# Patient Record
Sex: Male | Born: 1980 | Race: White | Hispanic: No | Marital: Married | Smoking: Never smoker
Health system: Southern US, Community
[De-identification: ages and names within clinical notes are randomized; demographics above are authoritative.]

## PROBLEM LIST (undated history)

## (undated) DIAGNOSIS — T7840XA Allergy, unspecified, initial encounter: Secondary | ICD-10-CM

## (undated) DIAGNOSIS — G8929 Other chronic pain: Secondary | ICD-10-CM

## (undated) DIAGNOSIS — J45909 Unspecified asthma, uncomplicated: Secondary | ICD-10-CM

## (undated) HISTORY — DX: Other chronic pain: G89.29

## (undated) HISTORY — DX: Unspecified asthma, uncomplicated: J45.909

## (undated) HISTORY — DX: Allergy, unspecified, initial encounter: T78.40XA

---

## 2003-11-19 HISTORY — PX: MANDIBLE SURGERY: SHX707

## 2004-02-12 ENCOUNTER — Other Ambulatory Visit: Payer: Self-pay

## 2005-01-20 ENCOUNTER — Emergency Department: Payer: Self-pay | Admitting: Emergency Medicine

## 2005-01-25 ENCOUNTER — Emergency Department: Payer: Self-pay | Admitting: Emergency Medicine

## 2005-03-02 ENCOUNTER — Emergency Department (HOSPITAL_COMMUNITY): Admission: EM | Admit: 2005-03-02 | Discharge: 2005-03-02 | Payer: Self-pay | Admitting: Emergency Medicine

## 2005-03-03 ENCOUNTER — Observation Stay (HOSPITAL_COMMUNITY): Admission: EM | Admit: 2005-03-03 | Discharge: 2005-03-04 | Payer: Self-pay | Admitting: Otolaryngology

## 2005-04-02 ENCOUNTER — Ambulatory Visit (HOSPITAL_COMMUNITY): Admission: RE | Admit: 2005-04-02 | Discharge: 2005-04-02 | Payer: Self-pay | Admitting: Otolaryngology

## 2005-04-11 ENCOUNTER — Ambulatory Visit (HOSPITAL_BASED_OUTPATIENT_CLINIC_OR_DEPARTMENT_OTHER): Admission: RE | Admit: 2005-04-11 | Discharge: 2005-04-11 | Payer: Self-pay | Admitting: Otolaryngology

## 2005-04-11 ENCOUNTER — Ambulatory Visit (HOSPITAL_COMMUNITY): Admission: RE | Admit: 2005-04-11 | Discharge: 2005-04-11 | Payer: Self-pay | Admitting: Otolaryngology

## 2007-01-11 IMAGING — DX DG ORTHOPANTOGRAM
1 series · 2 of 2 positions shown · non-contrast
Comparison: 03/03/05.

CLINICAL DATA: 23-year-old with fracture of the mandible. 
 ORTHOPANTOGRAM:

[view not recorded · B · 2 of 2 slices shown]
[im 1/2]
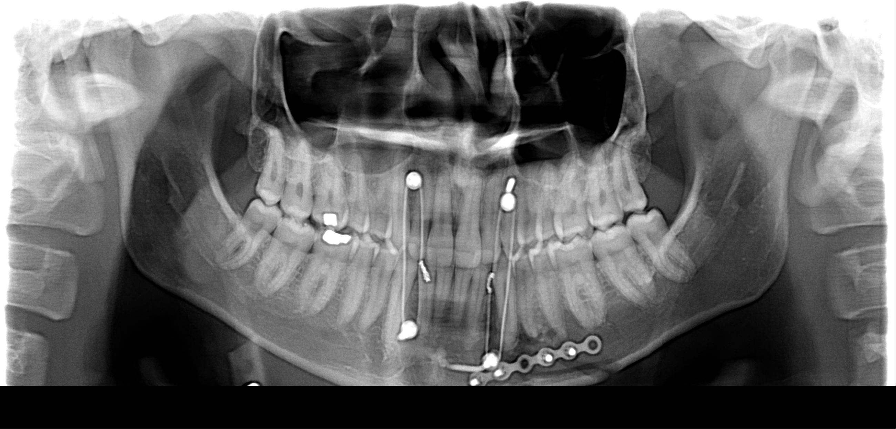
[im 2/2]
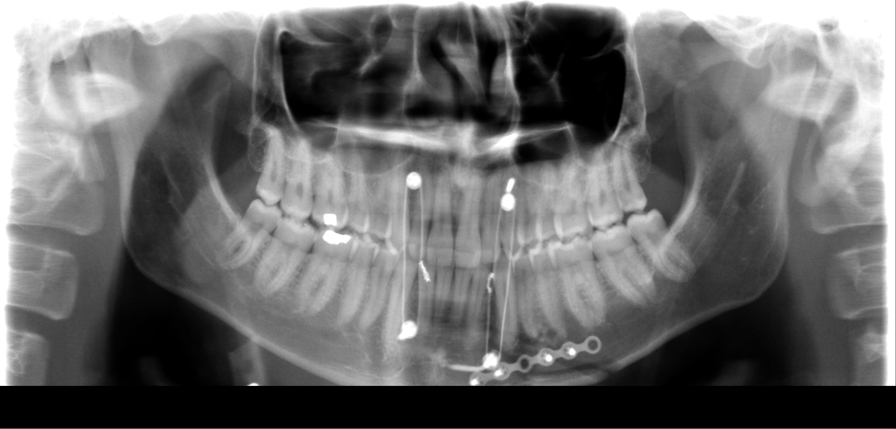

[2 of 2 positions shown; findings below may reference images not displayed]

The patient has undergone open reduction and fixation of left parasymphyseal mandibular fracture.  No new fractures are identified.
IMPRESSION: Status post open reduction and fixation of mandibular fracture.

## 2008-01-04 ENCOUNTER — Emergency Department: Payer: Self-pay | Admitting: Emergency Medicine

## 2008-01-12 ENCOUNTER — Ambulatory Visit: Payer: Self-pay | Admitting: Gastroenterology

## 2008-02-01 ENCOUNTER — Ambulatory Visit: Payer: Self-pay | Admitting: Unknown Physician Specialty

## 2008-04-04 ENCOUNTER — Ambulatory Visit: Payer: Self-pay | Admitting: Unknown Physician Specialty

## 2008-09-27 ENCOUNTER — Ambulatory Visit: Payer: Self-pay | Admitting: Specialist

## 2008-10-31 ENCOUNTER — Inpatient Hospital Stay: Payer: Self-pay | Admitting: Unknown Physician Specialty

## 2009-01-12 ENCOUNTER — Ambulatory Visit: Payer: Self-pay | Admitting: Pain Medicine

## 2010-09-05 ENCOUNTER — Ambulatory Visit: Payer: Self-pay

## 2011-03-29 ENCOUNTER — Emergency Department: Payer: Self-pay | Admitting: Emergency Medicine

## 2012-08-04 DIAGNOSIS — N200 Calculus of kidney: Secondary | ICD-10-CM | POA: Insufficient documentation

## 2013-01-04 ENCOUNTER — Ambulatory Visit: Payer: Self-pay

## 2013-02-01 ENCOUNTER — Ambulatory Visit: Payer: Self-pay

## 2014-01-17 ENCOUNTER — Emergency Department: Payer: Self-pay | Admitting: Emergency Medicine

## 2014-02-16 ENCOUNTER — Emergency Department: Payer: Self-pay | Admitting: Internal Medicine

## 2016-01-09 ENCOUNTER — Emergency Department
Admission: EM | Admit: 2016-01-09 | Discharge: 2016-01-09 | Disposition: A | Discharge status: Home / Self Care | Payer: BLUE CROSS/BLUE SHIELD | Attending: Student | Admitting: Student

## 2016-01-09 ENCOUNTER — Encounter: Payer: Self-pay | Admitting: Emergency Medicine

## 2016-01-09 DIAGNOSIS — Z79899 Other long term (current) drug therapy: Secondary | ICD-10-CM | POA: Diagnosis not present

## 2016-01-09 DIAGNOSIS — G8929 Other chronic pain: Secondary | ICD-10-CM | POA: Diagnosis not present

## 2016-01-09 DIAGNOSIS — M545 Low back pain, unspecified: Secondary | ICD-10-CM

## 2016-01-09 DIAGNOSIS — M549 Dorsalgia, unspecified: Secondary | ICD-10-CM | POA: Diagnosis present

## 2016-01-09 MED ORDER — HYDROMORPHONE HCL 2 MG PO TABS
2.0000 mg | ORAL_TABLET | ORAL | Status: AC
  Administered 2016-01-09: 2 mg via ORAL
  Filled 2016-01-09: qty 1

## 2016-01-09 NOTE — Discharge Instructions (Signed)
Chronic Back Pain  When back pain lasts longer than 3 months, it is called chronic back pain.People with chronic back pain often go through certain periods that are more intense (flare-ups).  CAUSES Chronic back pain can be caused by wear and tear (degeneration) on different structures in your back. These structures include:  The bones of your spine (vertebrae) and the joints surrounding your spinal cord and nerve roots (facets).  The strong, fibrous tissues that connect your vertebrae (ligaments). Degeneration of these structures may result in pressure on your nerves. This can lead to constant pain. HOME CARE INSTRUCTIONS  Avoid bending, heavy lifting, prolonged sitting, and activities which make the problem worse.  Take brief periods of rest throughout the day to reduce your pain. Lying down or standing usually is better than sitting while you are resting.  Take over-the-counter or prescription medicines only as directed by your caregiver. SEEK IMMEDIATE MEDICAL CARE IF:   You have weakness or numbness in one of your legs or feet.  You have trouble controlling your bladder or bowels.  You have nausea, vomiting, abdominal pain, shortness of breath, or fainting.   This information is not intended to replace advice given to you by your health care provider. Make sure you discuss any questions you have with your health care provider.   Document Released: 12/12/2004 Document Revised: 01/27/2012 Document Reviewed: 04/24/2015 Elsevier Interactive Patient Education [employer]   Your chronic pain medicines can not be managed from the ED. You must follow-up with your provider for ongoing symptom management. Follow-up with Dr. Gerilyn Pilgrim as soon as possible.   Emergency care providers appreciate that many patients coming to [place] are in severe pain and we wish to address their pain in the safest, most responsible manner.  It is important to recognize, however, that the proper treatment of  chronic pain differs from that of the pain of injuries and acute illnesses.  Our goal is to provider quality, safe, personalized care and we thank you for giving [place] the opportunity to serve you.  The use of narcotics and related agents for chronic pain syndromes may lead to additional physical and psychological problems.  Nearly as many people die from prescription narcotics each year as die from car crashes.  Additionally, this risk is increased if such prescriptions are obtained from a variety of sources.  Therefore, only your primary care physician or a pain management specialist is able to safely treat such syndromes with narcotic medications long-term.  Documentation revealing such prescriptions have been sought from multiple sources may prohibit [place] from providing a refill or different narcotic medication.  Your name may be checked first through the [facility] Controlled Substances Reporting System.  This database is a record of controlled substance medication prescriptions that the patient has received.  This has been established by [facility] in an effort to eliminate the dangerous, and often life-threatening, practice of obtaining multiple prescriptions from different medical providers.  If you have a chronic pain syndrome (i.e. chronic headaches, recurrent back or neck pain, dental pain, abdominal or pelvic pain without a specific diagnosis, or neuropathic pain such as fibromyalgia) or recurrent visits for the same condition without an acute diagnosis, you may be treated with non-narcotics and other non-addictive medicines.  Allergic reactions or negative side effects that may be reported by a patient to such medications will not typically lead to the use of a narcotic analgesic or other controlled substance as an alternative.  Patients managing chronic pain with  a personal physician should have provisions in place for breakthrough pain.  If you are in crisis, you should call your physician.   If your physician directs you to the emergency department, please have the doctor call and speak to our attending physician concerning your care.  When patients come to the Emergency Department (ED) with acute medical conditions in which the ED physician feels it is appropriate to prescribe narcotic or sedating pain medication, the physician will prescribe these in very limited quantities.  The amount of these medications will last only until you can see your primary care physician in his/her office.  Any patient who returns to the ED seeking refills should expect only non-narcotic pain medications.  In the event an acute medical condition exists and the emergency physician feels it is necessary that the patient be given a narcotic or sedating medication, a responsible adult driver should be present in the room prior to the medication being given by the nurse.  Prescriptions for narcotic or sedating medications that have been lost, stolen, or expired will NOT be refilled in the ED.  Patients who have chronic pain may receive non-narcotic prescriptions until seen by their primary care physician.  It is every patient's personal responsibility to maintain active prescriptions with his or her primary care physician or specialist.

## 2016-01-09 NOTE — ED Notes (Signed)
States he has chronic back pain   Last back injection was 2 weeks ago. States pain has returned  And unable to reach his md  States dilaudid works well but ran out yesterday

## 2016-01-09 NOTE — ED Provider Notes (Signed)
[facility] Emergency Department Provider Note ____________________________________________  Time seen: 2025  I have reviewed the triage vital signs and the nursing notes.  HISTORY  Chief Complaint  Back Pain  HPI Lawrence Pham is a 35 y.o. male due to ED accompanied by his mother for evaluation of chronic back pain that has been persistent since his SI joint injections 2 weeks prior. The patient is currently managed by [facility] neurology for his ongoing pain management. He reports that he was last seen 1 week ago and given a prescription for area since that time he's completed the prescription and when he called the office for follow-up and refill he found the office was closed. He presents to to the ED today requesting pain management for his ongoing symptoms. He denies any worsening of his baseline pain. He denies any distal paresthesias, foot drop, or incontinence of bladder or bowel. He reports an appointment is scheduled or March 8, and is hoping for medication to keep him until that time. He reports his pain at 10/10 in triage.   History reviewed. No pertinent past medical history.  There are no active problems to display for this patient.  History reviewed. No pertinent past surgical history.  Current Outpatient Rx  Name  Route  Sig  Dispense  Refill  . amitriptyline (ELAVIL) 25 MG tablet   Oral   Take 25 mg by mouth at bedtime.         . diazepam (VALIUM) 5 MG tablet   Oral   Take 5 mg by mouth every 12 (twelve) hours as needed for anxiety.         . hydrOXYzine (ATARAX/VISTARIL) 25 MG tablet   Oral   Take 25 mg by mouth 3 (three) times daily as needed.         Marland Kitchen omeprazole (PRILOSEC) 40 MG capsule   Oral   Take 40 mg by mouth daily.         . pregabalin (LYRICA) 150 MG capsule   Oral   Take 150 mg by mouth 2 (two) times daily.          Allergies Nucynta and Tramadol  No family history on file.  Social History Social  History  Substance Use Topics  . Smoking status: Never Smoker   . Smokeless tobacco: None  . Alcohol Use: No   Review of Systems  Constitutional: Negative for fever. Cardiovascular: Negative for chest pain. Respiratory: Negative for shortness of breath. Gastrointestinal: Negative for abdominal pain, vomiting and diarrhea. Genitourinary: Negative for dysuria. Musculoskeletal: Positive for back pain. Skin: Negative for rash. Neurological: Negative for headaches, focal weakness or numbness. ____________________________________________  PHYSICAL EXAM:  VITAL SIGNS: ED Triage Vitals  Enc Vitals Group     BP 01/09/16 1831 131/87 mmHg     Pulse Rate 01/09/16 1831 103     Resp 01/09/16 1831 20     Temp 01/09/16 1831 97.2 F (36.2 C)     Temp Source 01/09/16 1831 Oral     SpO2 01/09/16 1831 96 %     Weight 01/09/16 1831 145 lb (65.772 kg)     Height 01/09/16 1831 6' (1.829 m)     Head Cir --      Peak Flow --      Pain Score 01/09/16 1831 10     Pain Loc --      Pain Edu? --      Excl. in GC? --    Constitutional: Alert and  oriented. Well appearing and in no distress. Head: Normocephalic and atraumatic. Neck: Supple. No thyromegaly. Hematological/Lymphatic/Immunological: No cervical lymphadenopathy. Cardiovascular: Normal rate, regular rhythm.  Respiratory: Normal respiratory effort. No wheezes/rales/rhonchi. Gastrointestinal: Soft and nontender. No distention, rebound, guarding, or organomegaly. Musculoskeletal: Normal spinal alignment without midline tenderness, spasm, deformity, or step-off. Nontender with normal range of motion in all extremities.  Neurologic:  CN II-XII grossly intact. Normal LE DTRs bilaterally. Normal toe dorsiflexion. Normal gait without ataxia. Normal speech and language. No gross focal neurologic deficits are appreciated. Skin:  Skin is warm, dry and intact. No rash noted. Psychiatric: Mood and affect are normal. Patient exhibits appropriate insight  and judgment. ____________________________________________  PROCEDURES  Dilaudid 2 mg PO ____________________________________________  INITIAL IMPRESSION / ASSESSMENT AND PLAN / ED COURSE  He was discharged without a current prescription for refill on his medication. He is given instructions that he must follow with his ongoing and current chronic pain management provider. He is given information regarding the ED's stance on chronic pain management. He verbalizes understanding and is discharged to the care of his mother to follow-up with his primary care provider or pain management specialist as soon as possible. ____________________________________________  FINAL CLINICAL IMPRESSION(S) / ED DIAGNOSES  Final diagnoses:  Chronic low back pain      Lissa Hoard, PA-C 01/10/16 0020  Gayla Doss, MD 01/10/16 [phone]

## 2016-01-09 NOTE — ED Notes (Addendum)
Pt to ed with c/o chronic lower back pain.  Pt states he was seen and had injections in his back about 2 weeks ago, but since then has had burning and pain in bilat legs radiating from lower back. States he called his md but was told to come to ED.  Pt reports he has used dilaudid in the past but he is out of those at this time.

## 2016-05-01 ENCOUNTER — Other Ambulatory Visit: Payer: Self-pay | Admitting: Neurology

## 2016-05-01 DIAGNOSIS — M5416 Radiculopathy, lumbar region: Secondary | ICD-10-CM

## 2016-05-22 ENCOUNTER — Ambulatory Visit: Payer: BLUE CROSS/BLUE SHIELD

## 2017-06-23 ENCOUNTER — Encounter: Payer: Self-pay | Admitting: *Deleted

## 2017-06-23 DIAGNOSIS — Y92009 Unspecified place in unspecified non-institutional (private) residence as the place of occurrence of the external cause: Secondary | ICD-10-CM | POA: Insufficient documentation

## 2017-06-23 DIAGNOSIS — S61012A Laceration without foreign body of left thumb without damage to nail, initial encounter: Secondary | ICD-10-CM | POA: Insufficient documentation

## 2017-06-23 DIAGNOSIS — Y999 Unspecified external cause status: Secondary | ICD-10-CM | POA: Insufficient documentation

## 2017-06-23 DIAGNOSIS — Y939 Activity, unspecified: Secondary | ICD-10-CM | POA: Insufficient documentation

## 2017-06-23 DIAGNOSIS — Z79899 Other long term (current) drug therapy: Secondary | ICD-10-CM | POA: Insufficient documentation

## 2017-06-23 DIAGNOSIS — W25XXXA Contact with sharp glass, initial encounter: Secondary | ICD-10-CM | POA: Insufficient documentation

## 2017-06-23 NOTE — ED Triage Notes (Signed)
Pt has 3 small lacerations to left hand.  Pt stuck hand through a glass window.  Bleeding controlled.  Pt alert.

## 2017-06-24 ENCOUNTER — Emergency Department: Payer: Self-pay

## 2017-06-24 ENCOUNTER — Emergency Department
Admission: EM | Admit: 2017-06-24 | Discharge: 2017-06-24 | Disposition: A | Discharge status: Home / Self Care | Payer: Self-pay | Attending: Emergency Medicine | Admitting: Emergency Medicine

## 2017-06-24 DIAGNOSIS — S61412A Laceration without foreign body of left hand, initial encounter: Secondary | ICD-10-CM

## 2017-06-24 MED ORDER — OXYCODONE HCL 5 MG PO TABS
10.0000 mg | ORAL_TABLET | Freq: Once | ORAL | Status: AC
  Administered 2017-06-24: 10 mg via ORAL
  Filled 2017-06-24: qty 2

## 2017-06-24 NOTE — ED Provider Notes (Signed)
[facility] Emergency Department Provider Note  ____________________________________________   First MD Initiated Contact with Patient 06/24/17 0136     (approximate)  I have reviewed the triage vital signs and the nursing notes.   HISTORY  Chief Complaint Laceration    HPI Lawrence Pham is a 36 y.o. male left-hand dominant unemployed who comes to the emergency department after sustaining multiple lacerations to his left hand. He broke through a window in his own house trying to get into a locked door and suffered the wound. Last tetanus shot was 3-4 years ago. He denies numbness or weakness. He has moderate to severe pain in his left thumb and left thenar eminence. Pain is worse with movement and improved with rest.   No past medical history on file.  There are no active problems to display for this patient.   No past surgical history on file.  Prior to Admission medications   Medication Sig Start Date End Date Taking? Authorizing Provider  amitriptyline (ELAVIL) 25 MG tablet Take 25 mg by mouth at bedtime.    [provider]  diazepam (VALIUM) 5 MG tablet Take 5 mg by mouth every 12 (twelve) hours as needed for anxiety.    [provider]  hydrOXYzine (ATARAX/VISTARIL) 25 MG tablet Take 25 mg by mouth 3 (three) times daily as needed.    [provider]  omeprazole (PRILOSEC) 40 MG capsule Take 40 mg by mouth daily.    [provider]  pregabalin (LYRICA) 150 MG capsule Take 150 mg by mouth 2 (two) times daily.    [provider]    Allergies Nucynta [tapentadol] and Tramadol  No family history on file.  Social History Social History  Substance Use Topics  . Smoking status: Never Smoker  . Smokeless tobacco: Never Used  . Alcohol use No    Review of Systems Constitutional: No fever/chills ENT: No sore throat. Cardiovascular: Denies chest pain. Respiratory: Denies shortness of  breath. Gastrointestinal: No abdominal pain.  No nausea, no vomiting.  No diarrhea.  No constipation. Musculoskeletal: Negative for back pain. Neurological: Negative for headaches   ____________________________________________   PHYSICAL EXAM:  VITAL SIGNS: ED Triage Vitals  Enc Vitals Group     BP 06/23/17 2259 (!) 127/96     Pulse Rate 06/23/17 2259 (!) 115     Resp 06/23/17 2259 20     Temp 06/23/17 2259 98.2 F (36.8 C)     Temp Source 06/23/17 2259 Oral     SpO2 06/23/17 2259 96 %     Weight 06/23/17 2257 170 lb (77.1 kg)     Height 06/23/17 2257 6' (1.829 m)     Head Circumference --      Peak Flow --      Pain Score 06/23/17 2257 7     Pain Loc --      Pain Edu? --      Excl. in GC? --     Constitutional: Alert and oriented 4 appears uncomfortable Head: Atraumatic. Nose: No congestion/rhinnorhea. Mouth/Throat: No trismus Neck: No stridor.   Cardiovascular: Tachycardic regular rhythm Respiratory: Normal respiratory effort.  No retractions. Neurologic:  Normal speech and language. No gross focal neurologic deficits are appreciated.  Skin: 2 lacerations noted one over the proximal phalanx of his thumb 3 cm long the other very superficial over the thenar eminence 1 cm No tenderness over distal radius or distal ulna. No tenderness over snuffbox and no axial load discomfort Sensation  intact to light touch over first dorsal webspace, distal index finger, distal small finger Can flex and oppose  thumb, cross 2 on 3, and extend wrist 2+ radial pulse and less than 2 second capillary refill Compartments are soft     ____________________________________________  LABS (all labs ordered are listed, but only abnormal results are displayed)  Labs Reviewed - No data to display   __________________________________________  EKG   ____________________________________________  RADIOLOGY  X-ray with no foreign body no fracture  ____________________________________________   PROCEDURES  Procedure(s) performed: yes  LACERATION REPAIR Performed by: Merrily BrittleNeil Amee Boothe Authorized by: Merrily BrittleNeil Oluwafemi Villella Consent: Verbal consent obtained. Risks and benefits: risks, benefits and alternatives were discussed Consent given by: patient Patient identity confirmed: provided demographic data Prepped and Draped in normal sterile fashion Wound explored  Laceration Location: Left thumb  Laceration Length: 3cm  No Foreign Bodies seen or palpated  Irrigation method: Sink  Amount of cleaning: Copious   Skin closure: Steri-Strips and Dermabond    Patient tolerance: Patient tolerated the procedure well with no immediate complications.   Procedures  Critical Care performed: no  Observation: no ____________________________________________   INITIAL IMPRESSION / ASSESSMENT AND PLAN / ED COURSE  Pertinent labs & imaging results that were available during my care of the patient were reviewed by me and considered in my medical decision making (see chart for details).  I recommended the patient receive a digital block and sutures however he declined stating he did not like needles. He only wanted Steri-Strips and Dermabond in. I gave him a dose of oxycodone for his significant pain which improved it. His wounds were irrigated copiously with tap water in the sink and then I closed it with good approximation. He is discharged home in good condition.      ____________________________________________   FINAL CLINICAL IMPRESSION(S) / ED DIAGNOSES  Final diagnoses:  Laceration of left hand, foreign body presence unspecified, initial encounter      NEW MEDICATIONS STARTED DURING THIS VISIT:  Discharge Medication List as of 06/24/2017  2:43 AM       Note:  This document was prepared using Dragon voice recognition software and may include unintentional dictation errors.      Merrily Brittleifenbark, Montay Vanvoorhis, MD 06/24/17 [phone]

## 2017-06-24 NOTE — Discharge Instructions (Signed)
Please keep your hand clean and dry and follow-up with your primary care physician in 2 days for a wound check. If you are unable to see your primary care physician please return to our emergency department for recheck. Return sooner for any new or worsening symptoms such as worsening pain, numbness, weakness, or for any other concerns.  It was a pleasure to take care of you today, and thank you for coming to our emergency department.  If you have any questions or concerns before leaving please ask the nurse to grab me and I'm more than happy to go through your aftercare instructions again.  If you were prescribed any opioid pain medication today such as Norco, Vicodin, Percocet, morphine, hydrocodone, or oxycodone please make sure you do not drive when you are taking this medication as it can alter your ability to drive safely.  If you have any concerns once you are home that you are not improving or are in fact getting worse before you can make it to your follow-up appointment, please do not hesitate to call 911 and come back for further evaluation.  Merrily BrittleNeil Benno Brensinger, MD  No results found for this or any previous visit. Dg Hand Complete Left  Result Date: 06/24/2017 CLINICAL DATA:  Three small lacerations of the left hand after sticking hand through glass window tonight. EXAM: LEFT HAND - COMPLETE 3+ VIEW COMPARISON:  None. FINDINGS: There is no evidence of fracture or dislocation. No radiopaque foreign body is identified. There is no evidence of arthropathy or other focal bone abnormality. Soft tissues are unremarkable. IMPRESSION: No fracture or radiopaque foreign body. Reported soft tissue lacerations are not radiographically apparent possibly due to size or projection. Electronically Signed   By: Tollie Ethavid  Kwon M.D.   On: 06/24/2017 01:54

## 2018-12-31 ENCOUNTER — Other Ambulatory Visit: Payer: Self-pay | Admitting: Student

## 2018-12-31 DIAGNOSIS — M5412 Radiculopathy, cervical region: Secondary | ICD-10-CM

## 2018-12-31 DIAGNOSIS — G8929 Other chronic pain: Secondary | ICD-10-CM

## 2018-12-31 DIAGNOSIS — M5441 Lumbago with sciatica, right side: Secondary | ICD-10-CM

## 2019-01-11 ENCOUNTER — Ambulatory Visit: Payer: Medicare Other

## 2019-01-11 ENCOUNTER — Ambulatory Visit
Admission: RE | Admit: 2019-01-11 | Discharge: 2019-01-11 | Disposition: A | Discharge status: Home / Self Care | Payer: Medicare Other | Source: Ambulatory Visit | Attending: Student | Admitting: Student

## 2019-01-11 DIAGNOSIS — M5412 Radiculopathy, cervical region: Secondary | ICD-10-CM | POA: Insufficient documentation

## 2019-01-11 DIAGNOSIS — M5441 Lumbago with sciatica, right side: Secondary | ICD-10-CM | POA: Insufficient documentation

## 2019-01-11 DIAGNOSIS — G8929 Other chronic pain: Secondary | ICD-10-CM | POA: Diagnosis present

## 2019-02-16 ENCOUNTER — Ambulatory Visit: Payer: Medicare Other | Admitting: Nurse Practitioner

## 2019-03-04 NOTE — Progress Notes (Signed)
Patient's Name: Lawrence Pham  MRN: [id number]  Referring Provider: Ivar Drape, PA-C  DOB: 1981/07/29  PCP: Patient, No Pcp Per  DOS: 03/08/2019  Note by: Edward Jolly, MD  Service setting: Ambulatory outpatient  Specialty: Interventional Pain Management  Location: [facility] Pain Management Virtual Visit  Visit type: Initial Patient Evaluation  Patient type: New Patient   Pain Management Virtual Encounter Note - Virtual Visit via Telephone Telehealth (real-time audio visits between healthcare provider and patient).  Patient's Phone No.:  [phone] (home); There is no such number on file (mobile).; (Preferred) [phone] No e-mail address on record No Pharmacies Listed  Pre-screening note:  Our staff contacted Lawrence Pham and offered him an "in person", "face-to-face" appointment versus a telephone encounter. He indicated preferring the telephone encounter, at this time.  Primary Reason(s) for Visit: Tele-Encounter for initial evaluation of one or more chronic problems (new to examiner) potentially causing chronic pain, and posing a threat to normal musculoskeletal function. (Level of risk: High)  I attempted to contact Lawrence Pham on 03/08/2019 at 9:41 AM via telephone and clearly identified myself as Edward Jolly, MD.  Patient did not answer.

## 2019-03-08 ENCOUNTER — Other Ambulatory Visit: Payer: Self-pay

## 2019-03-08 ENCOUNTER — Ambulatory Visit
Payer: Medicare Other | Attending: Student in an Organized Health Care Education/Training Program | Admitting: Student in an Organized Health Care Education/Training Program

## 2019-03-11 ENCOUNTER — Encounter: Payer: Self-pay | Admitting: Student in an Organized Health Care Education/Training Program
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
## 2019-03-15 ENCOUNTER — Ambulatory Visit
Payer: Medicare Other | Attending: Student in an Organized Health Care Education/Training Program | Admitting: Student in an Organized Health Care Education/Training Program

## 2019-03-15 ENCOUNTER — Other Ambulatory Visit: Payer: Self-pay

## 2019-03-15 DIAGNOSIS — M170 Bilateral primary osteoarthritis of knee: Secondary | ICD-10-CM | POA: Insufficient documentation

## 2019-03-15 DIAGNOSIS — M5442 Lumbago with sciatica, left side: Secondary | ICD-10-CM | POA: Diagnosis not present

## 2019-03-15 DIAGNOSIS — G894 Chronic pain syndrome: Secondary | ICD-10-CM

## 2019-03-15 DIAGNOSIS — M542 Cervicalgia: Secondary | ICD-10-CM | POA: Diagnosis not present

## 2019-03-15 DIAGNOSIS — F1290 Cannabis use, unspecified, uncomplicated: Secondary | ICD-10-CM | POA: Insufficient documentation

## 2019-03-15 DIAGNOSIS — M25551 Pain in right hip: Secondary | ICD-10-CM

## 2019-03-15 DIAGNOSIS — M25552 Pain in left hip: Secondary | ICD-10-CM

## 2019-03-15 DIAGNOSIS — M5416 Radiculopathy, lumbar region: Secondary | ICD-10-CM

## 2019-03-15 DIAGNOSIS — M533 Sacrococcygeal disorders, not elsewhere classified: Secondary | ICD-10-CM

## 2019-03-15 DIAGNOSIS — M5441 Lumbago with sciatica, right side: Secondary | ICD-10-CM

## 2019-03-15 DIAGNOSIS — G8929 Other chronic pain: Secondary | ICD-10-CM | POA: Insufficient documentation

## 2020-07-13 ENCOUNTER — Other Ambulatory Visit: Payer: Self-pay

## 2020-07-13 ENCOUNTER — Ambulatory Visit
Payer: Medicare Other | Attending: Student in an Organized Health Care Education/Training Program | Admitting: Student in an Organized Health Care Education/Training Program

## 2020-07-13 ENCOUNTER — Encounter: Payer: Self-pay | Admitting: Student in an Organized Health Care Education/Training Program

## 2020-07-13 VITALS — BP 116/65 | HR 93 | Temp 97.3°F | Resp 18 | Ht 72.0 in | Wt 160.0 lb

## 2020-07-13 DIAGNOSIS — M533 Sacrococcygeal disorders, not elsewhere classified: Secondary | ICD-10-CM | POA: Insufficient documentation

## 2020-07-13 DIAGNOSIS — M5416 Radiculopathy, lumbar region: Secondary | ICD-10-CM | POA: Diagnosis present

## 2020-07-13 DIAGNOSIS — G894 Chronic pain syndrome: Secondary | ICD-10-CM | POA: Insufficient documentation

## 2020-07-13 DIAGNOSIS — M25551 Pain in right hip: Secondary | ICD-10-CM | POA: Diagnosis present

## 2020-07-13 DIAGNOSIS — M25552 Pain in left hip: Secondary | ICD-10-CM | POA: Diagnosis present

## 2020-07-13 DIAGNOSIS — G8929 Other chronic pain: Secondary | ICD-10-CM | POA: Diagnosis present

## 2020-07-13 NOTE — Patient Instructions (Signed)

## 2020-07-13 NOTE — Progress Notes (Signed)
PROVIDER NOTE: Information contained herein reflects review and annotations entered in association with encounter. Interpretation of such information and data should be left to medically-trained personnel. Information provided to patient can be located elsewhere in the medical record under "Patient Instructions". Document created using STT-dictation technology, any transcriptional errors that may result from process are unintentional.    Patient: Lawrence Pham  Service Category: E/M  Provider: Gillis Santa, MD  DOB: 03/16/1981  DOS: 07/13/2020  Specialty: Interventional Pain Management  MRN: [id number]  Setting: Ambulatory outpatient  PCP: System, Pcp Not In  Type: Established Patient    Referring Provider: No ref. provider found  Location: Office  Delivery: Face-to-face     HPI  Reason for encounter: Lawrence Pham, a 39 y.o. year old male, is here today for evaluation and management of his Chronic SI joint pain [M53.3, G89.29]. Lawrence Pham primary complain today is Back Pain, Hip Pain, and Knee Pain Last encounter: Practice (Visit date not found). My last encounter with him was on Visit date not found. Pertinent problems: Lawrence Pham has Chronic pain syndrome; Chronic bilateral low back pain with bilateral sciatica; Cervicalgia; Bilateral primary osteoarthritis of knee; Bilateral hip pain; Chronic SI joint pain; and Chronic lumbar radiculopathy on their pertinent problem list. Pain Assessment: Severity of Chronic pain is reported as a 7 /10. Location: Back Mid/radiates from mid back to low back and down both legs in the back to knee on left and to ankle on right.. Onset: More than a month ago. Quality: Sharp, Stabbing, Tingling (hot poker). Timing: Constant. Modifying factor(s): lying on heating pad. Vitals:  height is 6' (1.829 m) and weight is 160 lb (72.6 kg). His temperature is 97.3 F (36.3 C) (abnormal). His blood pressure is 116/65 and his pulse is 93. His respiration is 18 and oxygen  saturation is 98%.   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  Patient is referred by neurosurgery.  Patient was previously at a pain clinic in [place] in the past.  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  He had to wean himself off and he did that by utilizing CBD and THC.  Patient states that his last narcotic intake was in December 2018 and that he is not interested in narcotic medications as a treatment option in managing his pain.  Patient is also had previous epidural steroid injections as well as lumbar facet medial branch nerve blocks as well as lumbar radiofrequency ablation.  He states that the lumbar radiofrequency ablation was not effective for long-term pain management although he did get good short-term relief with the diagnostic lumbar facet medial branch nerve blocks.  He states that these were over 6 years ago.  Patient was honest and states that he does utilize [facility] and CBD in helping to manage his pain."  Patient was recently seen by neurosurgery. He was recommended to follow-up with pain management. 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 He is having pain with weightbearing on the right side. The pain significantly reduces when he sits down. Would like to obtain x-rays of his right hip and right sacroiliac joint and consider diagnostic right hip and right sacroiliac joint injection. Risks and benefits reviewed and patient would like to proceed.   ROS  Constitutional: Denies any fever or chills positive facial pain Gastrointestinal: No reported hemesis, hematochezia, vomiting, or acute GI distress Musculoskeletal: Right hip, right buttock pain Neurological: No reported episodes of acute onset apraxia, aphasia, dysarthria,  agnosia, amnesia, paralysis, loss of coordination, or loss of consciousness  Medication Review  gabapentin and glycopyrrolate  History Review  Allergy: Lawrence Pham is allergic to nucynta [tapentadol] and tramadol. Drug: Lawrence Pham  reports no history of drug use. Alcohol:  reports no history of alcohol use. Tobacco:  reports that he has never smoked. He uses smokeless tobacco. Social: Lawrence Pham  reports that he has never smoked. He uses smokeless tobacco. He reports that he does not drink alcohol and does not use drugs. Medical:  has no past medical history on file. Surgical: Lawrence Pham  has no past surgical history on file. Family: family history is not on file.  Laboratory Chemistry Profile   Renal No results found for: BUN, CREATININE, LABCREA, BCR, GFR, GFRAA, GFRNONAA, LABVMA, EPIRU, EPINEPH24HUR, NOREPRU, NOREPI24HUR, DOPARU, [id number]   Hepatic No results found for: AST, ALT, ALBUMIN, ALKPHOS, HCVAB, AMYLASE, LIPASE, AMMONIA   Electrolytes No results found for: NA, K, CL, CALCIUM, MG, PHOS   Bone No results found for: VD25OH, [id number], [id number], [id number], 25OHVITD1, 25OHVITD2, 25OHVITD3, TESTOFREE, TESTOSTERONE   Inflammation (CRP: Acute Phase) (ESR: Chronic Phase) No results found for: CRP, ESRSEDRATE, LATICACIDVEN     Note: Above Lab results reviewed.  Recent Imaging Review  MR LUMBAR SPINE WO CONTRAST CLINICAL DATA:  Bull rider. Multiple previous injuries. Back pain. Numbness and tingling.  EXAM: MRI LUMBAR SPINE WITHOUT CONTRAST  TECHNIQUE: Multiplanar, multisequence MR imaging of the lumbar spine was performed. No intravenous contrast was administered.  COMPARISON:  Radiography 01/18/2014.  FINDINGS: Segmentation:  5 lumbar type vertebral bodies.  Alignment:  Normal  Vertebrae:  No evidence of fracture or primary bone lesion.  Conus medullaris and cauda equina: Conus extends to the L1 level. Conus and cauda equina appear normal.  Paraspinal  and other soft tissues: Negative  Disc levels:  No abnormality at L4-5 or above. The discs are normal. No facet arthropathy. No canal or foraminal stenosis.  L5-S1: Disc degeneration with annular bulging and annular fissure. This contacts the thecal sac in the S1 root sleeves but does not cause neural compression. This could be associated with back pain or neural irritation.  IMPRESSION: Single level pathology at L5-S1. Disc degeneration with annular fissures and annular bulging. The annulus contacts the thecal sac in the S1 nerves. Neural compression is not seen, but this could be associated with nerve irritation.  Electronically Signed   By: Nelson Chimes M.D.   On: 01/12/2019 09:22 MR CERVICAL SPINE WO CONTRAST CLINICAL DATA:  Bowl rider. Multiple falls. Neck and back pain. Numbness and tingling in the hands.  EXAM: MRI CERVICAL SPINE WITHOUT CONTRAST  TECHNIQUE: Multiplanar, multisequence MR imaging of the cervical spine was performed. No intravenous contrast was administered.  COMPARISON:  CT 03/29/2011  FINDINGS: Alignment: No malalignment.  Vertebrae: No evidence of regional fracture.  Cord: No cord compression or primary cord lesion.  Posterior Fossa, vertebral arteries, paraspinal tissues: Negative  Disc levels:  No abnormality from the foramen magnum through C3-4.  C4-5: Minimal disc bulge.  No stenosis.  C5-6: Mild disc bulge.  No stenosis.  C6-7: Mild disc bulge.  No stenosis.  C7-T1 and T1-2: Normal.  T2-3: Central disc herniation with slight caudal migration effaces the ventral subarachnoid space and may indent the ventral cord slightly.  This level was not studied in the axial plane with this cervical study. If clinically a T2-3 disc herniation could explain the symptoms, consider thoracic evaluation.  IMPRESSION: Mild non-compressive disc bulges from C4-5 through C6-7.  Central disc herniation at T2-3 with slight caudal down  turning. Narrowing of the ventral subarachnoid space and slight indentation of the ventral cord. Consider thoracic exam if this could relate to the clinical presentation.  Electronically Signed   By: Nelson Chimes M.D.   On: 01/12/2019 09:12 Note: Reviewed        Physical Exam  General appearance: Well nourished, well developed, and well hydrated. In no apparent acute distress Mental status: Alert, oriented x 3 (person, place, & time)       Respiratory: No evidence of acute respiratory distress Eyes: PERLA Vitals: BP 116/65   Pulse 93   Temp (!) 97.3 F (36.3 C)   Resp 18   Ht 6' (1.829 m)   Wt 160 lb (72.6 kg)   SpO2 98%   BMI 21.70 kg/m  BMI: Estimated body mass index is 21.7 kg/m as calculated from the following:   Height as of this encounter: 6' (1.829 m).   Weight as of this encounter: 160 lb (72.6 kg). Ideal: Ideal body weight: 77.6 kg (171 lb 1.2 oz)   Lumbar Spine Area Exam  Skin & Axial Inspection: No masses, redness, or swelling Alignment: Symmetrical Functional ROM: Pain restricted ROM       Stability: No instability detected Muscle Tone/Strength: Functionally intact. No obvious neuro-muscular anomalies detected. Sensory (Neurological): Articular pain pattern Right hip arthralgia  Provocative Tests: Hyperextension/rotation test: (+) bilaterally for facet joint pain. Lumbar quadrant test (Kemp's test): deferred today       Lateral bending test: (+) ipsilateral radicular pain, bilaterally. Positive for bilateral foraminal stenosis. Patrick's Maneuver: (+) for right-sided S-I arthralgia and for right hip arthralgia FABER* test: (+) for right-sided S-I arthralgia and for right hip arthralgia S-I anterior distraction/compression test: deferred today         S-I lateral compression test: deferred today         S-I Thigh-thrust test: deferred today         S-I Gaenslen's test: deferred today         *(Flexion, ABduction and External Rotation)   Gait & Posture  Assessment  Ambulation: Patient ambulates using a cane Gait: Limited. Using assistive device to ambulate Posture: Difficulty standing up straight, due to pain  Lower Extremity Exam    Side: Right lower extremity  Side: Left lower extremity  Stability: No instability observed          Stability: No instability observed          Skin & Extremity Inspection: Skin color, temperature, and hair growth are WNL. No peripheral edema or cyanosis. No masses, redness, swelling, asymmetry, or associated skin lesions. No contractures.  Skin & Extremity Inspection: Skin color, temperature, and hair growth are WNL. No peripheral edema or cyanosis. No masses, redness, swelling, asymmetry, or associated skin lesions. No contractures.  Functional ROM: Pain restricted ROM for hip joint          Functional ROM: Unrestricted ROM                  Muscle Tone/Strength: Functionally intact. No obvious neuro-muscular anomalies detected.  Muscle Tone/Strength: Functionally intact. No obvious neuro-muscular anomalies detected.  Sensory (Neurological): Arthropathic arthralgia        Sensory (Neurological): Unimpaired  DTR: Patellar: deferred today Achilles: deferred today Plantar: deferred today  DTR: Patellar: deferred today Achilles: deferred today Plantar: deferred today  Palpation: No palpable anomalies  Palpation: No palpable anomalies    Assessment   Status Diagnosis  Worsening Worsening Worsening 1. Chronic SI joint pain   2. Bilateral hip pain   3. Chronic lumbar radiculopathy   4. Chronic pain syndrome      Updated Problems: Problem  Chronic Lumbar Radiculopathy  Chronic Pain Syndrome  Chronic Bilateral Low Back Pain With Bilateral Sciatica  Cervicalgia  Bilateral Primary Osteoarthritis of Knee  Bilateral Hip Pain  Chronic Si Joint Pain    Plan of Care   Right hip pain: Right hip x-ray, diagnostic right intra-articular hip injection Right SI joint pain: Positive Patrick's and  Faber's test on the right.  Right SI joint x-ray, right diagnostic sacroiliac joint injection Patient not interested in medication management From a lumbar spine standpoint, has failed multiple lumbar epidural steroid injections, lumbar facet medial branch nerve blocks, lumbar RFA.  Can consider Sprint PNS in the future.  Orders:  Orders Placed This Encounter  Procedures  . SACROILIAC JOINT INJECTION    Standing Status:   Future    Standing Expiration Date:   08/13/2020    Scheduling Instructions:     Side: RIGHT     Sedation: without     Timeframe: ASAP    Order Specific Question:   Where will this procedure be performed?    Answer:   [facility]  . HIP INJECTION    Standing Status:   Future    Standing Expiration Date:   10/13/2020    Scheduling Instructions:     Side: RIGHT     Sedation: without     Timeframe: As soon as schedule allows  . DG HIP UNILAT W OR W/O PELVIS 2-3 VIEWS RIGHT    Standing Status:   Future    Standing Expiration Date:   07/13/2021    Scheduling Instructions:     Please describe any evidence of DJD, such as joint narrowing, asymmetry, cysts, or any anomalies in bone density, production, or erosion.    Order Specific Question:   Reason for Exam (SYMPTOM  OR DIAGNOSIS REQUIRED)    Answer:   Right hip pain/arthralgia    Order Specific Question:   Preferred imaging location?    Answer:   [facility]    Order Specific Question:   Call Results- Best Contact Number?    Answer:   [phone] (Pain Clinic facility) (Dr. Dossie Arbour)  . DG Si Joints    Standing Status:   Future    Standing Expiration Date:   10/13/2020    Order Specific Question:   Reason for Exam (SYMPTOM  OR DIAGNOSIS REQUIRED)    Answer:   Right hip pain/arthralgia    Order Specific Question:   Preferred imaging location?    Answer:   [facility]    Order Specific Question:   Call Results- Best Contact Number?    Answer:   [phone] [facility])   Follow-up  plan:   Return in about 2 weeks (around 07/27/2020) for R SI-J + R hip , without sedation.   Recent Visits No visits were found meeting these conditions. Showing recent visits within past 90 days and meeting all other requirements Today's Visits Date Type Provider Dept  07/13/20 Office Visit Gillis Santa, MD [facility]  Showing today's visits and meeting all other requirements Future Appointments  Date Type Provider Dept  08/02/20 Appointment Gillis Santa, MD [facility]  Showing future appointments within next 90 days and meeting all other requirements  I discussed the assessment and treatment plan with the patient. The patient was provided an opportunity to ask questions and all were answered. The patient agreed with the plan and demonstrated an understanding of the instructions.  Patient advised to call back or seek an in-person evaluation if the symptoms or condition worsens.  Duration of encounter: 30 minutes.  Note by: Gillis Santa, MD Date: 07/13/2020; Time: 12:22 PM

## 2020-07-27 ENCOUNTER — Ambulatory Visit
Admission: RE | Admit: 2020-07-27 | Discharge: 2020-07-27 | Disposition: A | Discharge status: Home / Self Care | Payer: Medicare Other | Attending: Student in an Organized Health Care Education/Training Program | Admitting: Student in an Organized Health Care Education/Training Program

## 2020-07-27 ENCOUNTER — Ambulatory Visit
Admission: RE | Admit: 2020-07-27 | Discharge: 2020-07-27 | Disposition: A | Discharge status: Home / Self Care | Payer: Medicare Other | Source: Ambulatory Visit | Attending: Student in an Organized Health Care Education/Training Program | Admitting: Student in an Organized Health Care Education/Training Program

## 2020-07-27 ENCOUNTER — Other Ambulatory Visit: Payer: Self-pay

## 2020-07-27 DIAGNOSIS — M25552 Pain in left hip: Secondary | ICD-10-CM

## 2020-07-27 DIAGNOSIS — M533 Sacrococcygeal disorders, not elsewhere classified: Secondary | ICD-10-CM | POA: Insufficient documentation

## 2020-07-27 DIAGNOSIS — M25551 Pain in right hip: Secondary | ICD-10-CM | POA: Diagnosis present

## 2020-07-27 DIAGNOSIS — G8929 Other chronic pain: Secondary | ICD-10-CM | POA: Diagnosis present

## 2020-07-27 DIAGNOSIS — G894 Chronic pain syndrome: Secondary | ICD-10-CM | POA: Diagnosis present

## 2020-07-31 ENCOUNTER — Telehealth: Payer: Self-pay | Admitting: *Deleted

## 2020-07-31 NOTE — Telephone Encounter (Signed)
X-ray results read to patient. 

## 2020-08-02 ENCOUNTER — Other Ambulatory Visit: Payer: Self-pay

## 2020-08-02 ENCOUNTER — Ambulatory Visit (HOSPITAL_BASED_OUTPATIENT_CLINIC_OR_DEPARTMENT_OTHER): Payer: Medicare Other | Admitting: Student in an Organized Health Care Education/Training Program

## 2020-08-02 ENCOUNTER — Ambulatory Visit
Admission: RE | Admit: 2020-08-02 | Discharge: 2020-08-02 | Disposition: A | Discharge status: Home / Self Care | Payer: Medicare Other | Source: Ambulatory Visit | Attending: Student in an Organized Health Care Education/Training Program | Admitting: Student in an Organized Health Care Education/Training Program

## 2020-08-02 ENCOUNTER — Encounter: Payer: Self-pay | Admitting: Student in an Organized Health Care Education/Training Program

## 2020-08-02 VITALS — BP 124/92 | HR 79 | Temp 97.9°F | Resp 16 | Ht 72.0 in | Wt 160.0 lb

## 2020-08-02 DIAGNOSIS — M791 Myalgia, unspecified site: Secondary | ICD-10-CM | POA: Insufficient documentation

## 2020-08-02 DIAGNOSIS — G894 Chronic pain syndrome: Secondary | ICD-10-CM | POA: Insufficient documentation

## 2020-08-02 DIAGNOSIS — M533 Sacrococcygeal disorders, not elsewhere classified: Secondary | ICD-10-CM

## 2020-08-02 DIAGNOSIS — M7918 Myalgia, other site: Secondary | ICD-10-CM | POA: Diagnosis not present

## 2020-08-02 DIAGNOSIS — G8929 Other chronic pain: Secondary | ICD-10-CM | POA: Diagnosis not present

## 2020-08-02 MED ORDER — METHYLPREDNISOLONE ACETATE 40 MG/ML IJ SUSP
INTRAMUSCULAR | Status: AC
  Filled 2020-08-02: qty 1

## 2020-08-02 MED ORDER — DEXAMETHASONE SODIUM PHOSPHATE 10 MG/ML IJ SOLN
INTRAMUSCULAR | Status: AC
  Filled 2020-08-02: qty 1

## 2020-08-02 MED ORDER — IOHEXOL 180 MG/ML  SOLN
10.0000 mL | Freq: Once | INTRAMUSCULAR | Status: AC
  Administered 2020-08-02: 10 mL via INTRA_ARTICULAR

## 2020-08-02 MED ORDER — LIDOCAINE HCL 4 % EX SOLN
CUTANEOUS | Status: AC
  Filled 2020-08-02: qty 50

## 2020-08-02 MED ORDER — METHYLPREDNISOLONE ACETATE 40 MG/ML IJ SUSP
40.0000 mg | Freq: Once | INTRAMUSCULAR | Status: AC
  Administered 2020-08-02: 40 mg via INTRA_ARTICULAR

## 2020-08-02 MED ORDER — ROPIVACAINE HCL 2 MG/ML IJ SOLN
4.0000 mL | Freq: Once | INTRAMUSCULAR | Status: AC
  Administered 2020-08-02: 4 mL via INTRA_ARTICULAR

## 2020-08-02 MED ORDER — LIDOCAINE HCL 2 % IJ SOLN
20.0000 mL | Freq: Once | INTRAMUSCULAR | Status: AC
  Administered 2020-08-02: 400 mg

## 2020-08-02 MED ORDER — ROPIVACAINE HCL 2 MG/ML IJ SOLN
INTRAMUSCULAR | Status: AC
  Filled 2020-08-02: qty 10

## 2020-08-02 NOTE — Progress Notes (Signed)
Safety precautions to be maintained throughout the outpatient stay will include: orient to surroundings, keep bed in low position, maintain call bell within reach at all times, provide assistance with transfer out of bed and ambulation.  

## 2020-08-02 NOTE — Progress Notes (Signed)
PROVIDER NOTE: Information contained herein reflects review and annotations entered in association with encounter. Interpretation of such information and data should be left to medically-trained personnel. Information provided to patient can be located elsewhere in the medical record under "Patient Instructions". Document created using STT-dictation technology, any transcriptional errors that may result from process are unintentional.    Patient: Lawrence Pham  Service Category: Procedure  Provider: Edward Jolly, MD  DOB: June 18, 1981  DOS: 08/02/2020  Location: [facility]  MRN: [id number]  Setting: Ambulatory - outpatient  Referring Provider: No ref. provider found  Type: Established Patient  Specialty: Interventional Pain Management  PCP: Pcp, No   Primary Reason for Visit: Interventional Pain Management Treatment. CC: Hip Pain (left) and Leg Pain (left)  Procedure:          Anesthesia, Analgesia, Anxiolysis:  Type: Diagnostic Sacroiliac Joint Steroid Injection         And right piriformis trigger point injection Region: Inferior Lumbosacral Region Level: PIIS (Posterior Inferior Iliac Spine) Laterality: Right-Side  Type: Local Anesthesia  Local Anesthetic: Lidocaine 1-2%  Position: Prone           Indications: 1. Chronic SI joint pain   2. Piriformis muscle pain   3. Chronic pain syndrome    Pain Score: Pre-procedure: 8 /10 Post-procedure: 0-No pain (moving around)/10   Pre-op Assessment:  Lawrence Pham is a 39 y.o. (year old), male patient, seen today for interventional treatment. He  has no past surgical history on file. Lawrence Pham has a current medication list which includes the following prescription(s): gabapentin and glycopyrrolate. His primarily concern today is the Hip Pain (left) and Leg Pain (left)  Initial Vital Signs:  Pulse/HCG Rate: 87  Temp: 97.9 F (36.6 C) Resp: 16 BP: 112/82 SpO2: 98 %  BMI: Estimated body mass index is 21.7 kg/m as  calculated from the following:   Height as of this encounter: 6' (1.829 m).   Weight as of this encounter: 160 lb (72.6 kg).  Risk Assessment: Allergies: Reviewed. He is allergic to nucynta [tapentadol] and tramadol.  Allergy Precautions: None required Coagulopathies: Reviewed. None identified.  Blood-thinner therapy: None at this time Active Infection(s): Reviewed. None identified. Lawrence Pham is afebrile  Site Confirmation: Lawrence Pham was asked to confirm the procedure and laterality before marking the site Procedure checklist: Completed Consent: Before the procedure and under the influence of no sedative(s), amnesic(s), or anxiolytics, the patient was informed of the treatment options, risks and possible complications. To fulfill our ethical and legal obligations, as recommended by the American Medical Association's Code of Ethics, I have informed the patient of my clinical impression; the nature and purpose of the treatment or procedure; the risks, benefits, and possible complications of the intervention; the alternatives, including doing nothing; the risk(s) and benefit(s) of the alternative treatment(s) or procedure(s); and the risk(s) and benefit(s) of doing nothing. The patient was provided information about the general risks and possible complications associated with the procedure. These may include, but are not limited to: failure to achieve desired goals, infection, bleeding, organ or nerve damage, allergic reactions, paralysis, and death. In addition, the patient was informed of those risks and complications associated to the procedure, such as failure to decrease pain; infection; bleeding; organ or nerve damage with subsequent damage to sensory, motor, and/or autonomic systems, resulting in permanent pain, numbness, and/or weakness of one or several areas of the body; allergic reactions; (i.e.: anaphylactic reaction); and/or death. Furthermore, the patient was informed of those  risks and  complications associated with the medications. These include, but are not limited to: allergic reactions (i.e.: anaphylactic or anaphylactoid reaction(s)); adrenal axis suppression; blood sugar elevation that in diabetics may result in ketoacidosis or comma; water retention that in patients with history of congestive heart failure may result in shortness of breath, pulmonary edema, and decompensation with resultant heart failure; weight gain; swelling or edema; medication-induced neural toxicity; particulate matter embolism and blood vessel occlusion with resultant organ, and/or nervous system infarction; and/or aseptic necrosis of one or more joints. Finally, the patient was informed that Medicine is not an exact science; therefore, there is also the possibility of unforeseen or unpredictable risks and/or possible complications that may result in a catastrophic outcome. The patient indicated having understood very clearly. We have given the patient no guarantees and we have made no promises. Enough time was given to the patient to ask questions, all of which were answered to the patient's satisfaction. Lawrence Pham has indicated that he wanted to continue with the procedure. Attestation: I, the ordering provider, attest that I have discussed with the patient the benefits, risks, side-effects, alternatives, likelihood of achieving goals, and potential problems during recovery for the procedure that I have provided informed consent. Date  Time: 08/02/2020  8:59 AM  Pre-Procedure Preparation:  Monitoring: As per clinic protocol. Respiration, ETCO2, SpO2, BP, heart rate and rhythm monitor placed and checked for adequate function Safety Precautions: Patient was assessed for positional comfort and pressure points before starting the procedure. Time-out: I initiated and conducted the "Time-out" before starting the procedure, as per protocol. The patient was asked to participate by confirming the accuracy of the  "Time Out" information. Verification of the correct person, site, and procedure were performed and confirmed by me, the nursing staff, and the patient. "Time-out" conducted as per Joint Commission's Universal Protocol (UP.01.01.01). Time: 0941  Description of Procedure:          Target Area: Inferior, posterior, aspect of the sacroiliac fissure Approach: Posterior, paraspinal, ipsilateral approach. Area Prepped: Entire Lower Lumbosacral Region DuraPrep (Iodine Povacrylex [0.7% available iodine] and Isopropyl Alcohol, 74% w/w) Safety Precautions: Aspiration looking for blood return was conducted prior to all injections. At no point did we inject any substances, as a needle was being advanced. No attempts were made at seeking any paresthesias. Safe injection practices and needle disposal techniques used. Medications properly checked for expiration dates. SDV (single dose vial) medications used. Description of the Procedure: Protocol guidelines were followed. The patient was placed in position over the procedure table. The target area was identified and the area prepped in the usual manner. Skin & deeper tissues infiltrated with local anesthetic. Appropriate amount of time allowed to pass for local anesthetics to take effect. The procedure needle was advanced under fluoroscopic guidance into the sacroiliac joint until a firm endpoint was obtained. Proper needle placement secured. Negative aspiration confirmed. Solution injected in intermittent fashion, asking for systemic symptoms every 0.5cc of injectate. The needles were then removed and the area cleansed, making sure to leave some of the prepping solution back to take advantage of its long term bactericidal properties. Vitals:   08/02/20 0904 08/02/20 0938 08/02/20 0943 08/02/20 0948  BP: 112/82 (!) 113/91 (!) 124/94 (!) 124/92  Pulse: 87 82 77 79  Resp: 16 16 15 16   Temp: 97.9 F (36.6 C)     TempSrc: Temporal     SpO2: 98% 99% 100% 98%  Weight:  160 lb (72.6 kg)  Height: 6' (1.829 m)       Start Time: 0941 hrs. End Time: 0948 hrs. Materials:  Needle(s) Type: Spinal Needle Gauge: 22G Length: 3.5-in Medication(s): Please see orders for medications and dosing details. 8 cc solution made of 7 cc of 0.2% ropivacaine, 1 cc of methylprednisolone, 40 mg/cc. 3 cc injected into right sacroiliac joint intra-articular, 2 cc injected periarticular to the right sacroiliac joint.  Right piriformis trigger point injection was also performed under fluoroscopy.  Needle was positioned 1 cm inferior, lateral, deep to inferior pole of SI joint and approximately 3 cc of solution above were injected into the piriformis muscle.  Imaging Guidance (Non-Spinal):          Type of Imaging Technique: Fluoroscopy Guidance (Non-Spinal) Indication(s): Assistance in needle guidance and placement for procedures requiring needle placement in or near specific anatomical locations not easily accessible without such assistance. Exposure Time: Please see nurses notes. Contrast: Before injecting any contrast, we confirmed that the patient did not have an allergy to iodine, shellfish, or radiological contrast. Once satisfactory needle placement was completed at the desired level, radiological contrast was injected. Contrast injected under live fluoroscopy. No contrast complications. See chart for type and volume of contrast used. Fluoroscopic Guidance: I was personally present during the use of fluoroscopy. "Tunnel Vision Technique" used to obtain the best possible view of the target area. Parallax error corrected before commencing the procedure. "Direction-depth-direction" technique used to introduce the needle under continuous pulsed fluoroscopy. Once target was reached, antero-posterior, oblique, and lateral fluoroscopic projection used confirm needle placement in all planes. Images permanently stored in EMR. Interpretation: I personally interpreted the imaging  intraoperatively. Adequate needle placement confirmed in multiple planes. Appropriate spread of contrast into desired area was observed. No evidence of afferent or efferent intravascular uptake. Permanent images saved into the patient's record.  Antibiotic Prophylaxis:   Anti-infectives (From admission, onward)   None     Indication(s): None identified  Post-operative Assessment:  Post-procedure Vital Signs:  Pulse/HCG Rate: 79  Temp: 97.9 F (36.6 C) Resp: 16 BP: (!) 124/92 SpO2: 98 %  EBL: None  Complications: No immediate post-treatment complications observed by team, or reported by patient.  Note: The patient tolerated the entire procedure well. A repeat set of vitals were taken after the procedure and the patient was kept under observation following institutional policy, for this type of procedure. Post-procedural neurological assessment was performed, showing return to baseline, prior to discharge. The patient was provided with post-procedure discharge instructions, including a section on how to identify potential problems. Should any problems arise concerning this procedure, the patient was given instructions to immediately contact us, at any time, without hesitation. In any case, we plan to contact the patient by telephone for a follow-up status report regarding this interventional procedure.  Comments:  No additional relevant information. 5 out of 5 strength bilateral lower extremity: Plantar flexion, dorsiflexion, knee flexion, knee extension.  Plan of Care  Orders:  Orders Placed This Encounter  Procedures  . DG PAIN CLINIC C-ARM 1-60 MIN NO REPORT    Intraoperative interpretation by procedural physician at [facility].    Standing Status:   Standing    Number of Occurrences:   1    Order Specific Question:   Reason for exam:    Answer:   Assistance in needle guidance and placement for procedures requiring needle placement in or near specific anatomical  locations not easily accessible without such assistance.   Medications ordered for procedure:  Meds ordered this encounter  Medications  . iohexol (OMNIPAQUE) 180 MG/ML injection 10 mL    Must be Myelogram-compatible. If not available, you may substitute with a water-soluble, non-ionic, hypoallergenic, myelogram-compatible radiological contrast medium.  Marland Kitchen lidocaine (XYLOCAINE) 2 % (with pres) injection 400 mg  . methylPREDNISolone acetate (DEPO-MEDROL) injection 40 mg  . ropivacaine (PF) 2 mg/mL (0.2%) (NAROPIN) injection 4 mL   Medications administered: We administered iohexol, lidocaine, methylPREDNISolone acetate, and ropivacaine (PF) 2 mg/mL (0.2%).  See the medical record for exact dosing, route, and time of administration.  Follow-up plan:   Return in about 4 weeks (around 08/30/2020) for Post Procedure Evaluation, virtual.      Status post right sacroiliac joint injection, right piriformis injection   Recent Visits Date Type Provider Dept  07/13/20 Office Visit Edward Jolly, MD [facility]  Showing recent visits within past 90 days and meeting all other requirements Today's Visits Date Type Provider Dept  08/02/20 Procedure visit Edward Jolly, MD [facility]  Showing today's visits and meeting all other requirements Future Appointments Date Type Provider Dept  09/04/20 Appointment Edward Jolly, MD [facility]  Showing future appointments within next 90 days and meeting all other requirements  Disposition: Discharge home  Discharge (Date  Time): 08/02/2020; 0958 hrs.   Primary Care Physician: Pcp, No Location: [facility] Note by: Edward Jolly, MD Date: 08/02/2020; Time: 12:27 PM  Disclaimer:  Medicine is not an exact science. The only guarantee in medicine is that nothing is guaranteed. It is important to note that the decision to proceed with this intervention was based on the information collected from the  patient. The Data and conclusions were drawn from the patient's questionnaire, the interview, and the physical examination. Because the information was provided in large part by the patient, it cannot be guaranteed that it has not been purposely or unconsciously manipulated. Every effort has been made to obtain as much relevant data as possible for this evaluation. It is important to note that the conclusions that lead to this procedure are derived in large part from the available data. Always take into account that the treatment will also be dependent on availability of resources and existing treatment guidelines, considered by other Pain Management Practitioners as being common knowledge and practice, at the time of the intervention. For Medico-Legal purposes, it is also important to point out that variation in procedural techniques and pharmacological choices are the acceptable norm. The indications, contraindications, technique, and results of the above procedure should only be interpreted and judged by a Board-Certified Interventional Pain Specialist with extensive familiarity and expertise in the same exact procedure and technique.

## 2020-08-02 NOTE — Patient Instructions (Signed)

## 2020-08-31 ENCOUNTER — Encounter: Payer: Self-pay | Admitting: Student in an Organized Health Care Education/Training Program

## 2020-09-04 ENCOUNTER — Other Ambulatory Visit: Payer: Self-pay

## 2020-09-04 ENCOUNTER — Ambulatory Visit
Payer: Medicare Other | Attending: Student in an Organized Health Care Education/Training Program | Admitting: Student in an Organized Health Care Education/Training Program

## 2020-09-04 ENCOUNTER — Encounter: Payer: Self-pay | Admitting: Student in an Organized Health Care Education/Training Program

## 2020-09-04 DIAGNOSIS — G894 Chronic pain syndrome: Secondary | ICD-10-CM | POA: Diagnosis not present

## 2020-09-04 DIAGNOSIS — M7918 Myalgia, other site: Secondary | ICD-10-CM | POA: Diagnosis not present

## 2020-09-04 DIAGNOSIS — M533 Sacrococcygeal disorders, not elsewhere classified: Secondary | ICD-10-CM

## 2020-09-04 DIAGNOSIS — M170 Bilateral primary osteoarthritis of knee: Secondary | ICD-10-CM | POA: Diagnosis not present

## 2020-09-04 DIAGNOSIS — G8929 Other chronic pain: Secondary | ICD-10-CM

## 2020-09-04 NOTE — Progress Notes (Signed)
Patient: Lawrence Pham  Service Category: E/M  Provider: Gillis Santa, MD  DOB: [birth year]  DOS: 09/04/2020  Location: Office  MRN: [id number]  Setting: Ambulatory outpatient  Referring Provider: No ref. provider found  Type: Established Patient  Specialty: Interventional Pain Management  PCP: Pcp, No  Location: Remote location  Delivery: TeleHealth     Virtual Encounter - Pain Management PROVIDER NOTE: Information contained herein reflects review and annotations entered in association with encounter. Interpretation of such information and data should be left to medically-trained personnel. Information provided to patient can be located elsewhere in the medical record under "Patient Instructions". Document created using STT-dictation technology, any transcriptional errors that may result from process are unintentional.    Contact & Pharmacy Preferred: [phone] Home: [phone] (home) Mobile: [phone] (mobile) E-mail: No e-mail address on record  [id number] [place], [place] - [address] [place] [address] Phone: [phone] Fax: [phone]   Pre-screening  Mr. Guardia offered "in-person" vs "virtual" encounter. He indicated preferring virtual for this encounter.   Reason COVID-19*  Social distancing based on CDC and AMA recommendations.   I contacted [name] on 09/04/2020 via video conference.      I clearly identified myself as Gillis Santa, MD. I verified that I was speaking with the correct person using two identifiers (Name: SALOME COZBY, and date of birth: 1981/03/15).  Consent I sought verbal advanced consent from [name] for virtual visit interactions. I informed Mr. Bonn of possible security and privacy concerns, risks, and limitations associated with providing "not-in-person" medical evaluation and management services. I also informed Mr. Gosch of the availability of "in-person" appointments. Finally, I informed him  that there would be a charge for the virtual visit and that he could be  personally, fully or partially, financially responsible for it. Mr. Granja expressed understanding and agreed to proceed.   Historic Elements   Mr. TALON REGALA is a 39 y.o. year old, male patient evaluated today after our last contact on 08/02/2020. Mr. Ganoe  has no past medical history on file. He also  has no past surgical history on file. Mr. Brosnahan has a current medication list which includes the following prescription(s): gabapentin and glycopyrrolate. He  reports that he has never smoked. He uses smokeless tobacco. He reports that he does not drink alcohol and does not use drugs. Mr. Erhardt is allergic to nucynta [tapentadol] and tramadol.   HPI  Today, he is being contacted for a post-procedure assessment.   Post-Procedure Evaluation  Procedure (08/02/2020): RIGHT SI-J injection and Right Piriformis Injection   Sedation: Please see nurses note.  Effectiveness during initial hour after procedure(Ultra-Short Term Relief): 100 %   Local anesthetic used: Long-acting (4-6 hours) Effectiveness: Defined as any analgesic benefit obtained secondary to the administration of local anesthetics. This carries significant diagnostic value as to the etiological location, or anatomical origin, of the pain. Duration of benefit is expected to coincide with the duration of the local anesthetic used.  Effectiveness during initial 4-6 hours after procedure(Short-Term Relief): 100 %   Long-term benefit: Defined as any relief past the pharmacologic duration of the local anesthetics.  Effectiveness past the initial 6 hours after procedure(Long-Term Relief): 50 %   Current benefits: Defined as benefit that persist at this time.   Analgesia:  >75% relief Function: Somewhat improved ROM: Somewhat improved  Laboratory Chemistry Profile   Renal No results found for: BUN, CREATININE, LABCREA, BCR, GFR, GFRAA, GFRNONAA,  LABVMA, EPIRU,  [id number], NOREPRU, [id number], DOPARU, [id number]   Hepatic No results found for: AST, ALT, ALBUMIN, ALKPHOS, HCVAB, AMYLASE, LIPASE, AMMONIA   Electrolytes No results found for: NA, K, CL, CALCIUM, MG, PHOS   Bone No results found for: VD25OH, [id number], [id number], [id number], 25OHVITD1, 25OHVITD2, 25OHVITD3, TESTOFREE, TESTOSTERONE   Inflammation (CRP: Acute Phase) (ESR: Chronic Phase) No results found for: CRP, ESRSEDRATE, LATICACIDVEN     Note: Above Lab results reviewed.   Assessment  The primary encounter diagnosis was Bilateral primary osteoarthritis of knee. Diagnoses of Chronic SI joint pain, Piriformis muscle pain, and Chronic pain syndrome were also pertinent to this visit.  Plan of Care   Mr. JAQUELL SEDDON has a current medication list which includes the following long-term medication(s): gabapentin and glycopyrrolate.   Postprocedure evaluation after right sacroiliac joint injection and right piriformis injection.  Patient states that he has noticed approximately 50% reduction in his right SI joint and buttock pain with an improvement in his functional status.  He states that the pain intensity is less and that he is able to do more activities with less pain.  He is now complaining of bilateral knee pain.  Patient has osteoarthritis of bilateral knees.  Plan for intra-articular knee steroid injection.  Risk and benefits reviewed and patient like to proceed.  As needed order placed for repeat SI joint injection if patient has return of SI joint pain.  Orders:  Orders Placed This Encounter  Procedures  . KNEE INJECTION    Local Anesthetic & Steroid injection.    Standing Status:   Future    Standing Expiration Date:   12/05/2020    Scheduling Instructions:     Side: Bilateral     Sedation: None     Timeframe: As soon as schedule allows    Order Specific Question:   Where will this procedure be performed?    Answer:   [facility]  . KNEE INJECTION     Local Anesthetic & Steroid injection.    Standing Status:   Future    Standing Expiration Date:   12/05/2020    Scheduling Instructions:     Side: Bilateral     Sedation: None     Timeframe: As soon as schedule allows    Order Specific Question:   Where will this procedure be performed?    Answer:   [facility]  . SACROILIAC JOINT INJECTION    Scheduling timeframe: (PRN procedure) Mr. Haff will call when needed. Clinical indication: Low back pain, w/ or w/o groin pain. Sacroiliac joint pain Sedation: Usually done with sedation. (May be done without sedation if so desired by patient.) Requirements: NPO x 8 hrs.; Driver; Stop blood thinners.    Standing Status:   Standing    Number of Occurrences:   1    Standing Expiration Date:   09/04/2021    Scheduling Instructions:     Side: Right     Sedation: Patient's choice.    Order Specific Question:   Where will this procedure be performed?    Answer:   [facility]   Follow-up plan:   Return in about 2 weeks (around 09/18/2020) for B/L Knee steroid #1.     Status post right sacroiliac joint injection, right piriformis injection Helped significantly, repeat as needed.  Plan for bilateral knee intra-articular steroid.  Future considerations include Hyalgan, genicular nerve block.  Consider repeating SI joint/piriformis injection every 2 to 3 months   Recent Visits  Date Type Provider Dept  08/02/20 Procedure visit Gillis Santa, MD [facility]  07/13/20 Office Visit Gillis Santa, MD [facility]  Showing recent visits within past 90 days and meeting all other requirements Today's Visits Date Type Provider Dept  09/04/20 Telemedicine Gillis Santa, MD [facility]  Showing today's visits and meeting all other requirements Future Appointments No visits were found meeting these conditions. Showing future appointments within next 90 days and meeting all other requirements  I discussed the  assessment and treatment plan with the patient. The patient was provided an opportunity to ask questions and all were answered. The patient agreed with the plan and demonstrated an understanding of the instructions.  Patient advised to call back or seek an in-person evaluation if the symptoms or condition worsens.  Duration of encounter: 30 minutes.  Note by: Gillis Santa, MD Date: 09/04/2020; Time: 11:18 AM

## 2020-09-18 ENCOUNTER — Other Ambulatory Visit: Payer: Self-pay

## 2020-09-18 ENCOUNTER — Ambulatory Visit
Payer: Medicare Other | Attending: Student in an Organized Health Care Education/Training Program | Admitting: Student in an Organized Health Care Education/Training Program

## 2020-09-18 ENCOUNTER — Encounter: Payer: Self-pay | Admitting: Student in an Organized Health Care Education/Training Program

## 2020-09-18 VITALS — BP 115/80 | HR 77 | Temp 97.4°F | Resp 16 | Ht 72.0 in | Wt 160.0 lb

## 2020-09-18 DIAGNOSIS — M170 Bilateral primary osteoarthritis of knee: Secondary | ICD-10-CM | POA: Diagnosis not present

## 2020-09-18 DIAGNOSIS — G894 Chronic pain syndrome: Secondary | ICD-10-CM | POA: Diagnosis not present

## 2020-09-18 MED ORDER — ROPIVACAINE HCL 2 MG/ML IJ SOLN
9.0000 mL | Freq: Once | INTRAMUSCULAR | Status: AC
  Administered 2020-09-18: 9 mL via PERINEURAL
  Filled 2020-09-18: qty 10

## 2020-09-18 MED ORDER — METHYLPREDNISOLONE ACETATE 40 MG/ML IJ SUSP
40.0000 mg | Freq: Once | INTRAMUSCULAR | Status: AC
  Administered 2020-09-18: 40 mg
  Filled 2020-09-18: qty 1

## 2020-09-18 MED ORDER — LIDOCAINE HCL 2 % IJ SOLN
10.0000 mL | Freq: Once | INTRAMUSCULAR | Status: AC
  Administered 2020-09-18: 400 mg
  Filled 2020-09-18: qty 20

## 2020-09-18 MED ORDER — METHYLPREDNISOLONE ACETATE 40 MG/ML IJ SUSP
40.0000 mg | Freq: Once | INTRAMUSCULAR | Status: AC
  Administered 2020-09-18: 40 mg via INTRA_ARTICULAR
  Filled 2020-09-18: qty 1

## 2020-09-18 NOTE — Progress Notes (Signed)
PROVIDER NOTE: Information contained herein reflects review and annotations entered in association with encounter. Interpretation of such information and data should be left to medically-trained personnel. Information provided to patient can be located elsewhere in the medical record under "Patient Instructions". Document created using STT-dictation technology, any transcriptional errors that may result from process are unintentional.    Patient: Lawrence Pham  Service Category: Procedure  Provider: Edward Jolly, MD  DOB: 26-Nov-1980  DOS: 09/18/2020  Location: [facility]  MRN: [id number]  Setting: Ambulatory - outpatient  Referring Provider: No ref. provider found  Type: Established Patient  Specialty: Interventional Pain Management  PCP: Pcp, No   Primary Reason for Visit: Interventional Pain Management Treatment. CC: Knee Pain (bilateral )  Procedure:          Anesthesia, Analgesia, Anxiolysis:  Type: Diagnostic Intra-Articular Local anesthetic and steroid Knee Injection #1  Region: Medial infrapatellar Knee Region Level: Knee Joint Laterality: Bilateral  Type: Local Anesthesia Indication(s): Analgesia         Local Anesthetic: Lidocaine 1-2% Route: Infiltration (Barrett/IM) IV Access: Declined Sedation: Declined   Position: Sitting   Indications: 1. Bilateral primary osteoarthritis of knee   2. Chronic pain syndrome    Pain Score: Pre-procedure: 8 /10 Post-procedure: 5/10   Pre-op Assessment:  Lawrence Pham is a 39 y.o. (year old), male patient, seen today for interventional treatment. He  has no past surgical history on file. Lawrence Pham has a current medication list which includes the following prescription(s): gabapentin and glycopyrrolate. His primarily concern today is the Knee Pain (bilateral )  Initial Vital Signs:  Pulse/HCG Rate: 77  Temp: (!) 97.4 F (36.3 C) Resp: 16 BP: 115/80 SpO2: 99 %  BMI: Estimated body mass index is 21.7 kg/m as calculated from  the following:   Height as of this encounter: 6' (1.829 m).   Weight as of this encounter: 160 lb (72.6 kg).  Risk Assessment: Allergies: Reviewed. He is allergic to nucynta [tapentadol] and tramadol.  Allergy Precautions: None required Coagulopathies: Reviewed. None identified.  Blood-thinner therapy: None at this time Active Infection(s): Reviewed. None identified. Lawrence Pham is afebrile  Site Confirmation: Lawrence Pham was asked to confirm the procedure and laterality before marking the site Procedure checklist: Completed Consent: Before the procedure and under the influence of no sedative(s), amnesic(s), or anxiolytics, the patient was informed of the treatment options, risks and possible complications. To fulfill our ethical and legal obligations, as recommended by the American Medical Association's Code of Ethics, I have informed the patient of my clinical impression; the nature and purpose of the treatment or procedure; the risks, benefits, and possible complications of the intervention; the alternatives, including doing nothing; the risk(s) and benefit(s) of the alternative treatment(s) or procedure(s); and the risk(s) and benefit(s) of doing nothing. The patient was provided information about the general risks and possible complications associated with the procedure. These may include, but are not limited to: failure to achieve desired goals, infection, bleeding, organ or nerve damage, allergic reactions, paralysis, and death. In addition, the patient was informed of those risks and complications associated to the procedure, such as failure to decrease pain; infection; bleeding; organ or nerve damage with subsequent damage to sensory, motor, and/or autonomic systems, resulting in permanent pain, numbness, and/or weakness of one or several areas of the body; allergic reactions; (i.e.: anaphylactic reaction); and/or death. Furthermore, the patient was informed of those risks and complications  associated with the medications. These include, but are not limited  to: allergic reactions (i.e.: anaphylactic or anaphylactoid reaction(s)); adrenal axis suppression; blood sugar elevation that in diabetics may result in ketoacidosis or comma; water retention that in patients with history of congestive heart failure may result in shortness of breath, pulmonary edema, and decompensation with resultant heart failure; weight gain; swelling or edema; medication-induced neural toxicity; particulate matter embolism and blood vessel occlusion with resultant organ, and/or nervous system infarction; and/or aseptic necrosis of one or more joints. Finally, the patient was informed that Medicine is not an exact science; therefore, there is also the possibility of unforeseen or unpredictable risks and/or possible complications that may result in a catastrophic outcome. The patient indicated having understood very clearly. We have given the patient no guarantees and we have made no promises. Enough time was given to the patient to ask questions, all of which were answered to the patient's satisfaction. Lawrence Pham has indicated that he wanted to continue with the procedure. Attestation: I, the ordering provider, attest that I have discussed with the patient the benefits, risks, side-effects, alternatives, likelihood of achieving goals, and potential problems during recovery for the procedure that I have provided informed consent. Date  Time: 09/18/2020  1:54 PM  Pre-Procedure Preparation:  Monitoring: As per clinic protocol. Respiration, ETCO2, SpO2, BP, heart rate and rhythm monitor placed and checked for adequate function Safety Precautions: Patient was assessed for positional comfort and pressure points before starting the procedure. Time-out: I initiated and conducted the "Time-out" before starting the procedure, as per protocol. The patient was asked to participate by confirming the accuracy of the "Time Out"  information. Verification of the correct person, site, and procedure were performed and confirmed by me, the nursing staff, and the patient. "Time-out" conducted as per Joint Commission's Universal Protocol (UP.01.01.01). Time: 1411  Description of Procedure:          Target Area: Knee Joint Approach: Just above the Medial tibial plateau, lateral to the infrapatellar tendon. Area Prepped: Entire knee area, from the mid-thigh to the mid-shin. DuraPrep (Iodine Povacrylex [0.7% available iodine] and Isopropyl Alcohol, 74% w/w) Safety Precautions: Aspiration looking for blood return was conducted prior to all injections. At no point did we inject any substances, as a needle was being advanced. No attempts were made at seeking any paresthesias. Safe injection practices and needle disposal techniques used. Medications properly checked for expiration dates. SDV (single dose vial) medications used. Description of the Procedure: Protocol guidelines were followed. The patient was placed in position over the fluoroscopy table. The target area was identified and the area prepped in the usual manner. Skin & deeper tissues infiltrated with local anesthetic. Appropriate amount of time allowed to pass for local anesthetics to take effect. The procedure needles were then advanced to the target area. Proper needle placement secured. Negative aspiration confirmed. Solution injected in intermittent fashion, asking for systemic symptoms every 0.5cc of injectate. The needles were then removed and the area cleansed, making sure to leave some of the prepping solution back to take advantage of its long term bactericidal properties. Vitals:   09/18/20 1356  BP: 115/80  Pulse: 77  Resp: 16  Temp: (!) 97.4 F (36.3 C)  TempSrc: Temporal  SpO2: 99%  Weight: 160 lb (72.6 kg)  Height: 6' (1.829 m)    Start Time: 1411 hrs. End Time: 1415 hrs. Materials:  Needle(s) Type: Regular needle Gauge: 25G Length:  1.5-in Medication(s): Please see orders for medications and dosing details. 10 cc solution made of 8 cc of 0.2% ropivacaine,  2 cc of methylprednisolone, 40 mg/cc.  5 cc injected in each knee bilaterally. Imaging Guidance:          Type of Imaging Technique: None used Indication(s): N/A Exposure Time: No patient exposure Contrast: None used. Fluoroscopic Guidance: N/A Ultrasound Guidance: N/A Interpretation: N/A  Antibiotic Prophylaxis:   Anti-infectives (From admission, onward)   None     Indication(s): None identified  Post-operative Assessment:  Post-procedure Vital Signs:  Pulse/HCG Rate: 77  Temp: (!) 97.4 F (36.3 C) Resp: 16 BP: 115/80 SpO2: 99 %  EBL: None  Complications: No immediate post-treatment complications observed by team, or reported by patient.  Note: The patient tolerated the entire procedure well. A repeat set of vitals were taken after the procedure and the patient was kept under observation following institutional policy, for this type of procedure. Post-procedural neurological assessment was performed, showing return to baseline, prior to discharge. The patient was provided with post-procedure discharge instructions, including a section on how to identify potential problems. Should any problems arise concerning this procedure, the patient was given instructions to immediately contact us, at any time, without hesitation. In any case, we plan to contact the patient by telephone for a follow-up status report regarding this interventional procedure.  Comments:  No additional relevant information.  Plan of Care  Medications ordered for procedure: Meds ordered this encounter  Medications  . methylPREDNISolone acetate (DEPO-MEDROL) injection 40 mg  . methylPREDNISolone acetate (DEPO-MEDROL) injection 40 mg  . ropivacaine (PF) 2 mg/mL (0.2%) (NAROPIN) injection 9 mL  . lidocaine (XYLOCAINE) 2 % (with pres) injection 200 mg   Medications administered: We  administered methylPREDNISolone acetate, methylPREDNISolone acetate, ropivacaine (PF) 2 mg/mL (0.2%), and lidocaine.  See the medical record for exact dosing, route, and time of administration.  Follow-up plan:   Return in about 6 weeks (around 10/30/2020) for Post Procedure Evaluation, virtual.      Status post right sacroiliac joint injection, right piriformis injection Helped significantly, repeat as needed.  09/18/20:  bilateral knee intra-articular steroid.  Future considerations include Hyalgan, genicular nerve block.  Consider repeating SI joint/piriformis injection every 2 to 3 months    Recent Visits Date Type Provider Dept  09/04/20 Telemedicine Edward Jolly, MD [facility]  08/02/20 Procedure visit Edward Jolly, MD [facility]  07/13/20 Office Visit Edward Jolly, MD [facility]  Showing recent visits within past 90 days and meeting all other requirements Today's Visits Date Type Provider Dept  09/18/20 Procedure visit Edward Jolly, MD [facility]  Showing today's visits and meeting all other requirements Future Appointments Date Type Provider Dept  10/30/20 Appointment Edward Jolly, MD [facility]  Showing future appointments within next 90 days and meeting all other requirements  Disposition: Discharge home  Discharge (Date  Time): 09/18/2020; 1417 hrs.   Primary Care Physician: Pcp, No Location: [facility] Note by: Edward Jolly, MD Date: 09/18/2020; Time: 2:22 PM  Disclaimer:  Medicine is not an exact science. The only guarantee in medicine is that nothing is guaranteed. It is important to note that the decision to proceed with this intervention was based on the information collected from the patient. The Data and conclusions were drawn from the patient's questionnaire, the interview, and the physical examination. Because the information was provided in large part by the patient, it cannot be  guaranteed that it has not been purposely or unconsciously manipulated. Every effort has been made to obtain as much relevant data as possible  for this evaluation. It is important to note that the conclusions that lead to this procedure are derived in large part from the available data. Always take into account that the treatment will also be dependent on availability of resources and existing treatment guidelines, considered by other Pain Management Practitioners as being common knowledge and practice, at the time of the intervention. For Medico-Legal purposes, it is also important to point out that variation in procedural techniques and pharmacological choices are the acceptable norm. The indications, contraindications, technique, and results of the above procedure should only be interpreted and judged by a Board-Certified Interventional Pain Specialist with extensive familiarity and expertise in the same exact procedure and technique.

## 2020-09-18 NOTE — Progress Notes (Signed)
Safety precautions to be maintained throughout the outpatient stay will include: orient to surroundings, keep bed in low position, maintain call bell within reach at all times, provide assistance with transfer out of bed and ambulation.  

## 2020-09-18 NOTE — Patient Instructions (Signed)

## 2020-09-19 ENCOUNTER — Telehealth: Payer: Self-pay | Admitting: *Deleted

## 2020-09-19 NOTE — Telephone Encounter (Signed)
Attempted to call for post procedure follow-up. Message left. 

## 2020-10-30 ENCOUNTER — Other Ambulatory Visit: Payer: Self-pay

## 2020-10-30 ENCOUNTER — Ambulatory Visit
Payer: Medicare Other | Attending: Student in an Organized Health Care Education/Training Program | Admitting: Student in an Organized Health Care Education/Training Program

## 2020-10-30 ENCOUNTER — Encounter: Payer: Self-pay | Admitting: Student in an Organized Health Care Education/Training Program

## 2020-10-30 DIAGNOSIS — G894 Chronic pain syndrome: Secondary | ICD-10-CM | POA: Diagnosis not present

## 2020-10-30 DIAGNOSIS — M170 Bilateral primary osteoarthritis of knee: Secondary | ICD-10-CM | POA: Diagnosis not present

## 2020-10-30 NOTE — Progress Notes (Signed)
Patient: Lawrence Pham  Service Category: E/M  Provider: Edward Jolly, MD  DOB: Dec 17, 1980  DOS: 10/30/2020  Location: Office  MRN: [id number]  Setting: Ambulatory outpatient  Referring Provider: No ref. provider found  Type: Established Patient  Specialty: Interventional Pain Management  PCP: Pcp, No  Location: Home  Delivery: TeleHealth     Virtual Encounter - Pain Management PROVIDER NOTE: Information contained herein reflects review and annotations entered in association with encounter. Interpretation of such information and data should be left to medically-trained personnel. Information provided to patient can be located elsewhere in the medical record under "Patient Instructions". Document created using STT-dictation technology, any transcriptional errors that may result from process are unintentional.    Contact & Pharmacy Preferred: [phone] Home: [phone] (home) Mobile: [phone] (mobile) E-mail: No e-mail address on record  [id number] [place], [place] - [address] [place] [address] Phone: [phone] Fax: [phone]   Pre-screening  Mr. Yurchak offered "in-person" vs "virtual" encounter. He indicated preferring virtual for this encounter.   Reason COVID-19*  Social distancing based on CDC and AMA recommendations.   I contacted [name] on 10/30/2020 via video conference.      I clearly identified myself as Edward Jolly, MD. I verified that I was speaking with the correct person using two identifiers (Name: BURNELL HURTA, and date of birth: May 07, 1981).  Consent I sought verbal advanced consent from [name] for virtual visit interactions. I informed Mr. Cupples of possible security and privacy concerns, risks, and limitations associated with providing "not-in-person" medical evaluation and management services. I also informed Mr. Gribble of the availability of "in-person" appointments. Finally, I informed him that there  would be a charge for the virtual visit and that he could be  personally, fully or partially, financially responsible for it. Mr. Vandyne expressed understanding and agreed to proceed.   Historic Elements   Mr. ARVIL UTZ is a 39 y.o. year old, male patient evaluated today after our last contact on 09/18/2020. Mr. Friday  has no past medical history on file. He also  has no past surgical history on file. Mr. Friedhoff has a current medication list which includes the following prescription(s): gabapentin and glycopyrrolate. He  reports that he has never smoked. He uses smokeless tobacco. He reports that he does not drink alcohol and does not use drugs. Mr. Olguin is allergic to nucynta [tapentadol] and tramadol.   HPI  Today, he is being contacted for a post-procedure assessment.   Virtual visit today, follow-up after bilateral knee intra-articular steroid injection performed on 09/18/2020.  Unfortunately, the patient did not receive any significant analgesic benefits in regards to his knee pain after the steroid injection.  No side effects.  We discussed neck steps which could include diagnostic genicular nerve block and possible radiofrequency ablation of the genicular nerves for his persistent knee pain related to knee osteoarthritis.  Risk and benefits of this procedure were discussed in great detail and patient would like to proceed.  We will plan on doing this procedure with sedation.   Assessment  The primary encounter diagnosis was Bilateral primary osteoarthritis of knee. A diagnosis of Chronic pain syndrome was also pertinent to this visit.  Plan of Care  Mr. NORTON BIVINS has a current medication list which includes the following long-term medication(s): gabapentin and glycopyrrolate.  1. Bilateral primary osteoarthritis of knee -Status post bilateral intra-articular knee steroid injection which was not helpful.  Discussed diagnostic  genicular nerve block and possible RFA depending  upon response. - GENICULAR NERVE BLOCK; Future   Orders:  Orders Placed This Encounter  Procedures  . GENICULAR NERVE BLOCK    Indication(s):  Sub-acute knee pain    Standing Status:   Future    Standing Expiration Date:   01/28/2021    Scheduling Instructions:     Side: Bilateral     Sedation: with     Timeframe: 3-4 weeks    Order Specific Question:   Where will this procedure be performed?    Answer:   [facility]   Follow-up plan:   Return in about 4 weeks (around 11/27/2020) for B/L GNB w sedation.     Status post right sacroiliac joint injection, right piriformis injection Helped significantly, repeat as needed.  09/18/20:  bilateral knee intra-articular steroid: Not helpful.  Future considerations include Hyalgan, genicular nerve block.  Consider repeating SI joint/piriformis injection every 2 to 3 months     Recent Visits Date Type Provider Dept  09/18/20 Procedure visit Edward Jolly, MD [facility]  09/04/20 Telemedicine Edward Jolly, MD [facility]  08/02/20 Procedure visit Edward Jolly, MD [facility]  Showing recent visits within past 90 days and meeting all other requirements Today's Visits Date Type Provider Dept  10/30/20 Telemedicine Edward Jolly, MD [facility]  Showing today's visits and meeting all other requirements Future Appointments No visits were found meeting these conditions. Showing future appointments within next 90 days and meeting all other requirements  I discussed the assessment and treatment plan with the patient. The patient was provided an opportunity to ask questions and all were answered. The patient agreed with the plan and demonstrated an understanding of the instructions.  Patient advised to call back or seek an in-person evaluation if the symptoms or condition worsens.  Duration of encounter: 22 minutes.  Note by: Edward Jolly, MD Date: 10/30/2020; Time: 1:52 PM

## 2020-11-20 ENCOUNTER — Other Ambulatory Visit: Payer: Self-pay

## 2020-11-20 ENCOUNTER — Ambulatory Visit
Admission: RE | Admit: 2020-11-20 | Discharge: 2020-11-20 | Disposition: A | Discharge status: Home / Self Care | Payer: Medicare Other | Source: Ambulatory Visit | Attending: Student in an Organized Health Care Education/Training Program | Admitting: Student in an Organized Health Care Education/Training Program

## 2020-11-20 ENCOUNTER — Encounter: Payer: Self-pay | Admitting: Student in an Organized Health Care Education/Training Program

## 2020-11-20 ENCOUNTER — Ambulatory Visit (HOSPITAL_BASED_OUTPATIENT_CLINIC_OR_DEPARTMENT_OTHER): Payer: Medicare Other | Admitting: Student in an Organized Health Care Education/Training Program

## 2020-11-20 VITALS — BP 116/81 | HR 82 | Temp 98.7°F | Resp 16 | Ht 70.0 in | Wt 155.0 lb

## 2020-11-20 DIAGNOSIS — M170 Bilateral primary osteoarthritis of knee: Secondary | ICD-10-CM | POA: Diagnosis not present

## 2020-11-20 DIAGNOSIS — G894 Chronic pain syndrome: Secondary | ICD-10-CM | POA: Insufficient documentation

## 2020-11-20 MED ORDER — ROPIVACAINE HCL 2 MG/ML IJ SOLN
9.0000 mL | Freq: Once | INTRAMUSCULAR | Status: AC
  Administered 2020-11-20: 9 mL via PERINEURAL

## 2020-11-20 MED ORDER — ROPIVACAINE HCL 2 MG/ML IJ SOLN
INTRAMUSCULAR | Status: AC
  Filled 2020-11-20: qty 20

## 2020-11-20 MED ORDER — LIDOCAINE HCL 2 % IJ SOLN
20.0000 mL | Freq: Once | INTRAMUSCULAR | Status: AC
  Administered 2020-11-20: 200 mg

## 2020-11-20 MED ORDER — DEXAMETHASONE SODIUM PHOSPHATE 10 MG/ML IJ SOLN
INTRAMUSCULAR | Status: AC
  Filled 2020-11-20: qty 2

## 2020-11-20 MED ORDER — DEXAMETHASONE SODIUM PHOSPHATE 10 MG/ML IJ SOLN
10.0000 mg | Freq: Once | INTRAMUSCULAR | Status: AC
  Administered 2020-11-20: 10 mg

## 2020-11-20 MED ORDER — LACTATED RINGERS IV SOLN
1000.0000 mL | Freq: Once | INTRAVENOUS | Status: AC
  Administered 2020-11-20: 1000 mL via INTRAVENOUS

## 2020-11-20 MED ORDER — FENTANYL CITRATE (PF) 100 MCG/2ML IJ SOLN
INTRAMUSCULAR | Status: AC
  Filled 2020-11-20: qty 2

## 2020-11-20 MED ORDER — FENTANYL CITRATE (PF) 100 MCG/2ML IJ SOLN
25.0000 ug | INTRAMUSCULAR | Status: DC | PRN
  Administered 2020-11-20: 100 ug via INTRAVENOUS

## 2020-11-20 NOTE — Progress Notes (Signed)
PROVIDER NOTE: Information contained herein reflects review and annotations entered in association with encounter. Interpretation of such information and data should be left to medically-trained personnel. Information provided to patient can be located elsewhere in the medical record under "Patient Instructions". Document created using STT-dictation technology, any transcriptional errors that may result from process are unintentional.    Patient: Lawrence Pham  Service Category: Procedure  Provider: Edward Jolly, MD  DOB: 1981/05/11  DOS: 11/20/2020  Location: [facility]  MRN: [id number]  Setting: Ambulatory - outpatient  Referring Provider: No ref. provider found  Type: Established Patient  Specialty: Interventional Pain Management  PCP: Pcp, No   Primary Reason for Visit: Interventional Pain Management Treatment. CC: Knee Pain (Bilateral )  Procedure:          Anesthesia, Analgesia, Anxiolysis:  Type: Genicular Nerves Block (Superolateral, Superomedial, and Inferomedial Genicular Nerves)          CPT: 64450      Primary Purpose: Diagnostic Region: Lateral, Anterior, and Medial aspects of the knee joint, above and below the knee joint proper. Level: Superior and inferior to the knee joint. Target Area: For Genicular Nerve block(s), the targets are: the superolateral genicular nerve, located in the lateral distal portion of the femoral shaft as it curves to form the lateral epicondyle, in the region of the distal femoral metaphysis; the superomedial genicular nerve, located in the medial distal portion of the femoral shaft as it curves to form the medial epicondyle; and the inferomedial genicular nerve, located in the medial, proximal portion of the tibial shaft, as it curves to form the medial epicondyle, in the region of the proximal tibial metaphysis. Approach: Anterior, percutaneous, ipsilateral approach. Laterality: Bilateral  Type: Moderate (Conscious) Sedation combined with  Local Anesthesia Indication(s): Analgesia and Anxiety Route: Intravenous (IV) IV Access: Secured Sedation: Meaningful verbal contact was maintained at all times during the procedure  Local Anesthetic: Lidocaine 1-2%  Position: Modified Fowler's position with pillows under the targeted knee(s).   Indications: 1. Bilateral primary osteoarthritis of knee   2. Chronic pain syndrome    Pain Score: Pre-procedure: 8 /10 Post-procedure: 0-No pain (standing , moving, pt numb)/10   Pre-op H&P Assessment:  Mr. Burbach is a 40 y.o. (year old), male patient, seen today for interventional treatment. He  has no past surgical history on file. Mr. Schirtzinger has a current medication list which includes the following prescription(s): alfuzosin, gabapentin, and glycopyrrolate, and the following Facility-Administered Medications: fentanyl. His primarily concern today is the Knee Pain (Bilateral )  Initial Vital Signs:  Pulse/HCG Rate: 93ECG Heart Rate: 65 Temp: 98.1 F (36.7 C) Resp: 16 BP: 113/80 SpO2: 99 %  BMI: Estimated body mass index is 22.24 kg/m as calculated from the following:   Height as of this encounter: 5\' 10"  (1.778 m).   Weight as of this encounter: 155 lb (70.3 kg).  Risk Assessment: Allergies: Reviewed. He is allergic to nucynta [tapentadol] and tramadol.  Allergy Precautions: None required Coagulopathies: Reviewed. None identified.  Blood-thinner therapy: None at this time Active Infection(s): Reviewed. None identified. Mr. Mierzwa is afebrile  Site Confirmation: Mr. Engram was asked to confirm the procedure and laterality before marking the site Procedure checklist: Completed Consent: Before the procedure and under the influence of no sedative(s), amnesic(s), or anxiolytics, the patient was informed of the treatment options, risks and possible complications. To fulfill our ethical and legal obligations, as recommended by the American Medical Association's Code of Ethics, I have  informed  the patient of my clinical impression; the nature and purpose of the treatment or procedure; the risks, benefits, and possible complications of the intervention; the alternatives, including doing nothing; the risk(s) and benefit(s) of the alternative treatment(s) or procedure(s); and the risk(s) and benefit(s) of doing nothing. The patient was provided information about the general risks and possible complications associated with the procedure. These may include, but are not limited to: failure to achieve desired goals, infection, bleeding, organ or nerve damage, allergic reactions, paralysis, and death. In addition, the patient was informed of those risks and complications associated to the procedure, such as failure to decrease pain; infection; bleeding; organ or nerve damage with subsequent damage to sensory, motor, and/or autonomic systems, resulting in permanent pain, numbness, and/or weakness of one or several areas of the body; allergic reactions; (i.e.: anaphylactic reaction); and/or death. Furthermore, the patient was informed of those risks and complications associated with the medications. These include, but are not limited to: allergic reactions (i.e.: anaphylactic or anaphylactoid reaction(s)); adrenal axis suppression; blood sugar elevation that in diabetics may result in ketoacidosis or comma; water retention that in patients with history of congestive heart failure may result in shortness of breath, pulmonary edema, and decompensation with resultant heart failure; weight gain; swelling or edema; medication-induced neural toxicity; particulate matter embolism and blood vessel occlusion with resultant organ, and/or nervous system infarction; and/or aseptic necrosis of one or more joints. Finally, the patient was informed that Medicine is not an exact science; therefore, there is also the possibility of unforeseen or unpredictable risks and/or possible complications that may result in a  catastrophic outcome. The patient indicated having understood very clearly. We have given the patient no guarantees and we have made no promises. Enough time was given to the patient to ask questions, all of which were answered to the patient's satisfaction. Mr. Salminen has indicated that he wanted to continue with the procedure. Attestation: I, the ordering provider, attest that I have discussed with the patient the benefits, risks, side-effects, alternatives, likelihood of achieving goals, and potential problems during recovery for the procedure that I have provided informed consent. Date  Time: 11/20/2020 10:53 AM  Pre-Procedure Preparation:  Monitoring: As per clinic protocol. Respiration, ETCO2, SpO2, BP, heart rate and rhythm monitor placed and checked for adequate function Safety Precautions: Patient was assessed for positional comfort and pressure points before starting the procedure. Time-out: I initiated and conducted the "Time-out" before starting the procedure, as per protocol. The patient was asked to participate by confirming the accuracy of the "Time Out" information. Verification of the correct person, site, and procedure were performed and confirmed by me, the nursing staff, and the patient. "Time-out" conducted as per Joint Commission's Universal Protocol (UP.01.01.01). Time: 1133  Description of Procedure:          Area Prepped: Entire knee area, from mid-thigh to mid-shin, lateral, anterior, and medial aspects. DuraPrep (Iodine Povacrylex [0.7% available iodine] and Isopropyl Alcohol, 74% w/w) Safety Precautions: Aspiration looking for blood return was conducted prior to all injections. At no point did we inject any substances, as a needle was being advanced. No attempts were made at seeking any paresthesias. Safe injection practices and needle disposal techniques used. Medications properly checked for expiration dates. SDV (single dose vial) medications used. Description of the  Procedure: Protocol guidelines were followed. The patient was placed in position over the procedure table. The target area was identified and the area prepped in the usual manner. Skin & deeper tissues infiltrated with  local anesthetic. Appropriate amount of time allowed to pass for local anesthetics to take effect. The procedure needles were then advanced to the target area. Proper needle placement secured. Negative aspiration confirmed. Solution injected in intermittent fashion, asking for systemic symptoms every 0.5cc of injectate. The needles were then removed and the area cleansed, making sure to leave some of the prepping solution back to take advantage of its long term bactericidal properties.  Vitals:   11/20/20 1147 11/20/20 1155 11/20/20 1205 11/20/20 1215  BP: (!) 103/91 115/84 112/79 116/81  Pulse:  67 82   Resp: 16 16 16 16   Temp:  98.7 F (37.1 C)    TempSrc:      SpO2: 98% 99% 99% 100%  Weight:      Height:        Start Time: 1133 hrs. End Time: 1146 hrs. Materials:  Needle(s) Type: Spinal Needle Gauge: 25G Length: 3.5-in Medication(s): Please see orders for medications and dosing details. 6 cc solution made of 5 cc of 0.2% ropivacaine, 1 cc of Decadron 10 mg/cc.  2 cc injected at each level above for the left genicular nerves 6 cc solution made of 5 cc of 0.2% ropivacaine, 1 cc of Decadron 10 mg/cc.  2 cc injected at each level above for the right genicular nerves Total steroid dose: 20 mg of Decadron Imaging Guidance (Non-Spinal):          Type of Imaging Technique: Fluoroscopy Guidance (Non-Spinal) Indication(s): Assistance in needle guidance and placement for procedures requiring needle placement in or near specific anatomical locations not easily accessible without such assistance. Exposure Time: Please see nurses notes. Contrast: None used. Fluoroscopic Guidance: I was personally present during the use of fluoroscopy. "Tunnel Vision Technique" used to obtain the best  possible view of the target area. Parallax error corrected before commencing the procedure. "Direction-depth-direction" technique used to introduce the needle under continuous pulsed fluoroscopy. Once target was reached, antero-posterior, oblique, and lateral fluoroscopic projection used confirm needle placement in all planes. Images permanently stored in EMR. Interpretation: No contrast injected. I personally interpreted the imaging intraoperatively. Adequate needle placement confirmed in multiple planes. Permanent images saved into the patient's record.  Antibiotic Prophylaxis:   Anti-infectives (From admission, onward)   None     Indication(s): None identified  Post-operative Assessment:  Post-procedure Vital Signs:  Pulse/HCG Rate: 8284 Temp: 98.7 F (37.1 C) (temporal) Resp: 16 BP: 116/81 SpO2: 100 %  EBL: None  Complications: No immediate post-treatment complications observed by team, or reported by patient.  Note: The patient tolerated the entire procedure well. A repeat set of vitals were taken after the procedure and the patient was kept under observation following institutional policy, for this type of procedure. Post-procedural neurological assessment was performed, showing return to baseline, prior to discharge. The patient was provided with post-procedure discharge instructions, including a section on how to identify potential problems. Should any problems arise concerning this procedure, the patient was given instructions to immediately contact us, at any time, without hesitation. In any case, we plan to contact the patient by telephone for a follow-up status report regarding this interventional procedure.  Comments:  No additional relevant information.  Plan of Care  Orders:  Orders Placed This Encounter  Procedures  . [facility] C-ARM 1-60 MIN NO REPORT    Intraoperative interpretation by procedural physician at [place].    Standing Status:    Standing    Number of Occurrences:   1  Order Specific Question:   Reason for exam:    Answer:   Assistance in needle guidance and placement for procedures requiring needle placement in or near specific anatomical locations not easily accessible without such assistance.    Medications ordered for procedure: Meds ordered this encounter  Medications  . lidocaine (XYLOCAINE) 2 % (with pres) injection 400 mg  . lactated ringers infusion 1,000 mL  . fentaNYL (SUBLIMAZE) injection 25-50 mcg    Make sure Narcan is available in the pyxis when using this medication. In the event of respiratory depression (RR< 8/min): Titrate NARCAN (naloxone) in increments of 0.1 to 0.2 mg IV at 2-3 minute intervals, until desired degree of reversal.  . ropivacaine (PF) 2 mg/mL (0.2%) (NAROPIN) injection 9 mL  . dexamethasone (DECADRON) injection 10 mg  . dexamethasone (DECADRON) injection 10 mg   Medications administered: We administered lidocaine, lactated ringers, fentaNYL, ropivacaine (PF) 2 mg/mL (0.2%), dexamethasone, and dexamethasone.  See the medical record for exact dosing, route, and time of administration.  Follow-up plan:   Return in about 4 weeks (around 12/18/2020) for Post Procedure Evaluation, virtual.      Status post right sacroiliac joint injection, right piriformis injection Helped significantly, repeat as needed.  09/18/20:  bilateral knee intra-articular steroid: Not helpful.  Bilateral genicular nerve block 11/20/2020.  Consider repeating SI joint/piriformis injection every 2 to 3 months      Recent Visits Date Type Provider Dept  10/30/20 Telemedicine Edward Jolly, MD [facility]  09/18/20 Procedure visit Edward Jolly, MD [facility]  09/04/20 Telemedicine Edward Jolly, MD [facility]  Showing recent visits within past 90 days and meeting all other requirements Today's Visits Date Type Provider Dept  11/20/20 Procedure visit Edward Jolly, MD [facility]  Showing today's visits and meeting all other requirements Future Appointments Date Type Provider Dept  12/18/20 Appointment Edward Jolly, MD [facility]  Showing future appointments within next 90 days and meeting all other requirements  Disposition: Discharge home  Discharge (Date  Time): 11/20/2020; 1219 hrs.   Primary Care Physician: Pcp, No Location: [facility] Note by: Edward Jolly, MD Date: 11/20/2020; Time: 1:04 PM  Disclaimer:  Medicine is not an exact science. The only guarantee in medicine is that nothing is guaranteed. It is important to note that the decision to proceed with this intervention was based on the information collected from the patient. The Data and conclusions were drawn from the patient's questionnaire, the interview, and the physical examination. Because the information was provided in large part by the patient, it cannot be guaranteed that it has not been purposely or unconsciously manipulated. Every effort has been made to obtain as much relevant data as possible for this evaluation. It is important to note that the conclusions that lead to this procedure are derived in large part from the available data. Always take into account that the treatment will also be dependent on availability of resources and existing treatment guidelines, considered by other Pain Management Practitioners as being common knowledge and practice, at the time of the intervention. For Medico-Legal purposes, it is also important to point out that variation in procedural techniques and pharmacological choices are the acceptable norm. The indications, contraindications, technique, and results of the above procedure should only be interpreted and judged by a Board-Certified Interventional Pain Specialist with extensive familiarity and expertise in the same exact procedure and technique.

## 2020-11-20 NOTE — Patient Instructions (Signed)

## 2020-11-20 NOTE — Progress Notes (Signed)
Safety precautions to be maintained throughout the outpatient stay will include: orient to surroundings, keep bed in low position, maintain call bell within reach at all times, provide assistance with transfer out of bed and ambulation.  

## 2020-11-21 ENCOUNTER — Telehealth: Payer: Self-pay

## 2020-11-21 NOTE — Telephone Encounter (Signed)
Post procedure follow up.  LM 

## 2020-12-13 ENCOUNTER — Encounter: Payer: Self-pay | Admitting: Student in an Organized Health Care Education/Training Program

## 2020-12-13 ENCOUNTER — Ambulatory Visit
Payer: Medicare Other | Attending: Student in an Organized Health Care Education/Training Program | Admitting: Student in an Organized Health Care Education/Training Program

## 2020-12-13 ENCOUNTER — Other Ambulatory Visit: Payer: Self-pay

## 2020-12-13 DIAGNOSIS — G894 Chronic pain syndrome: Secondary | ICD-10-CM

## 2020-12-13 DIAGNOSIS — M170 Bilateral primary osteoarthritis of knee: Secondary | ICD-10-CM | POA: Diagnosis not present

## 2020-12-13 NOTE — Progress Notes (Signed)
Patient: Lawrence Pham  Service Category: E/M  Provider: Edward Jolly, MD  DOB: 11-09-1981  DOS: 12/13/2020  Location: Office  MRN: [id number]  Setting: Ambulatory outpatient  Referring Provider: No ref. provider found  Type: Established Patient  Specialty: Interventional Pain Management  PCP: Pcp, No  Location: Home  Delivery: TeleHealth     Virtual Encounter - Pain Management PROVIDER NOTE: Information contained herein reflects review and annotations entered in association with encounter. Interpretation of such information and data should be left to medically-trained personnel. Information provided to patient can be located elsewhere in the medical record under "Patient Instructions". Document created using STT-dictation technology, any transcriptional errors that may result from process are unintentional.    Contact & Pharmacy Preferred: [phone] Home: [phone] (home) Mobile: [phone] (mobile) E-mail: No e-mail address on record  [id number] [place], [place] - [address] [place] [address] Phone: [phone] Fax: [phone]   Pre-screening  Lawrence Pham offered "in-person" vs "virtual" encounter. He indicated preferring virtual for this encounter.   Reason COVID-19*  Social distancing based on CDC and AMA recommendations.   I contacted Lawrence Pham on 12/13/2020 via video conference.      I clearly identified myself as Edward Jolly, MD. I verified that I was speaking with the correct person using two identifiers (Name: Lawrence Pham, and date of birth: 1981/09/01).  Consent I sought verbal advanced consent from Lawrence Pham for virtual visit interactions. I informed Lawrence Pham of possible security and privacy concerns, risks, and limitations associated with providing "not-in-person" medical evaluation and management services. I also informed Lawrence Pham of the availability of "in-person" appointments. Finally, I informed him that there  would be a charge for the virtual visit and that he could be  personally, fully or partially, financially responsible for it. Lawrence Pham expressed understanding and agreed to proceed.   Historic Elements   Lawrence Pham is a 40 y.o. year old, male patient evaluated today after our last contact on 11/20/2020. Lawrence Pham  has no past medical history on file. He also  has no past surgical history on file. Lawrence Pham has a current medication list which includes the following prescription(s): alfuzosin, gabapentin, glycopyrrolate, azelastine, and fluticasone. He  reports that he has never smoked. He uses smokeless tobacco. He reports that he does not drink alcohol and does not use drugs. Lawrence Pham is allergic to nucynta [tapentadol] and tramadol.   HPI  Today, he is being contacted for a post-procedure assessment.   Post-Procedure Evaluation  Procedure (11/20/2020): Bilateral GNB #1  Sedation: Please see nurses note.  Effectiveness during initial hour after procedure(Ultra-Short Term Relief): 100 %   Local anesthetic used: Long-acting (4-6 hours) Effectiveness: Defined as any analgesic benefit obtained secondary to the administration of local anesthetics. This carries significant diagnostic value as to the etiological location, or anatomical origin, of the pain. Duration of benefit is expected to coincide with the duration of the local anesthetic used.  Effectiveness during initial 4-6 hours after procedure(Short-Term Relief): 100 %   Long-term benefit: Defined as any relief past the pharmacologic duration of the local anesthetics.  Effectiveness past the initial 6 hours after procedure(Long-Term Relief): 75% pain relief for 4 days  Current benefits: Defined as benefit that persist at this time.   Analgesia:  Back to baseline Function: Back to baseline ROM: Back to baseline   Assessment  The primary encounter diagnosis was Bilateral primary osteoarthritis of knee. A  diagnosis of Chronic  pain syndrome was also pertinent to this visit.  Plan of Care   Lawrence Pham has a current medication list which includes the following long-term medication(s): gabapentin, glycopyrrolate, azelastine, and fluticasone.  Patient is status post bilateral genicular nerve block which provided him with 75% pain relief for approximately 5 days with return of pain thereafter.  We discussed neck steps which could include radiofrequency ablation of the genicular nerves.  Risks and benefits of this procedure were discussed in great detail and Lawrence Pham would like to proceed.  We will start with a right genicular nerve RFA first followed then by the left.  Orders:  Orders Placed This Encounter  Procedures  . Radiofrequency,Genicular    Standing Status:   Future    Standing Expiration Date:   12/13/2021    Scheduling Instructions:     Side(s): Right     Level(s): Superior-Lateral, Superior-Medial, and Inferior-Medial Genicular Nerve(s)     Sedation: Patient's choice.     Scheduling Timeframe: As soon as pre-approved    Order Specific Question:   Where will this procedure be performed?    Answer:   [facility]  . Radiofrequency,Genicular    Standing Status:   Future    Standing Expiration Date:   12/13/2021    Scheduling Instructions:     Side(s): LEFT     Level(s): Superior-Lateral, Superior-Medial, and Inferior-Medial Genicular Nerve(s)     Sedation: Patient's choice.     Scheduling Timeframe: 2 weeks after right    Order Specific Question:   Where will this procedure be performed?    Answer:   [facility]   Follow-up plan:   Return in about 2 weeks (around 12/27/2020) for RIGHT GN RFA, with sedation.     Status post right sacroiliac joint injection, right piriformis injection Helped significantly, repeat as needed.  09/18/20:  bilateral knee intra-articular steroid: Not helpful.  Bilateral genicular nerve block 11/20/2020.  Consider repeating SI joint/piriformis injection every 2  to 3 months       Recent Visits Date Type Provider Dept  11/20/20 Procedure visit Edward Jolly, MD [facility]  10/30/20 Telemedicine Edward Jolly, MD [facility]  09/18/20 Procedure visit Edward Jolly, MD [facility]  Showing recent visits within past 90 days and meeting all other requirements Today's Visits Date Type Provider Dept  12/13/20 Telemedicine Edward Jolly, MD [facility]  Showing today's visits and meeting all other requirements Future Appointments No visits were found meeting these conditions. Showing future appointments within next 90 days and meeting all other requirements  I discussed the assessment and treatment plan with the patient. The patient was provided an opportunity to ask questions and all were answered. The patient agreed with the plan and demonstrated an understanding of the instructions.  Patient advised to call back or seek an in-person evaluation if the symptoms or condition worsens.  Duration of encounter: 20 minutes.  Note by: Edward Jolly, MD Date: 12/13/2020; Time: 3:31 PM

## 2020-12-18 ENCOUNTER — Telehealth: Payer: Medicare Other | Admitting: Student in an Organized Health Care Education/Training Program

## 2021-05-28 ENCOUNTER — Telehealth: Payer: Self-pay

## 2021-05-28 ENCOUNTER — Telehealth: Payer: Self-pay | Admitting: Student in an Organized Health Care Education/Training Program

## 2021-05-28 NOTE — Telephone Encounter (Signed)
Spoke with patient.  States he is out of gabapentin.  States he does not take it all of the time when I asked how he was getting by without a prescription since January with no refills.  Patient states he is taking his wifes when needed.  Informed him Dr Cherylann Ratel was on vacation and I would send this message to him for when he returns.  Patient has an appointment on 06-12-2021.

## 2021-05-28 NOTE — Telephone Encounter (Signed)
Patients wife called stating the pharmacy says they do not have a refill on gabapentin. I scheduled patient for 06-12-21 for med mgmt appt. They are out of town next week. Can you please check on this with pharmacy. If no refills please ask Dr. Pernell Dupre if he will send 1 script for gabapentin. Thanks you, Olegario Messier

## 2021-05-28 NOTE — Telephone Encounter (Signed)
LM for patient to call office

## 2021-05-29 NOTE — Telephone Encounter (Signed)
I spoke with this patient yesterday about this issue.  He has not had an appointment since January.  He cancelled his last appointment.

## 2021-06-04 MED ORDER — GABAPENTIN 300 MG PO CAPS
300.0000 mg | ORAL_CAPSULE | Freq: Three times a day (TID) | ORAL | 1 refills | Status: DC
Start: 2021-06-04 — End: 2021-07-05

## 2021-06-12 ENCOUNTER — Encounter: Payer: Medicare Other | Admitting: Student in an Organized Health Care Education/Training Program

## 2021-07-05 ENCOUNTER — Encounter: Payer: Self-pay | Admitting: Student in an Organized Health Care Education/Training Program

## 2021-07-05 ENCOUNTER — Other Ambulatory Visit: Payer: Self-pay

## 2021-07-05 ENCOUNTER — Ambulatory Visit
Payer: Medicare Other | Attending: Student in an Organized Health Care Education/Training Program | Admitting: Student in an Organized Health Care Education/Training Program

## 2021-07-05 VITALS — BP 104/84 | HR 112 | Temp 96.9°F | Resp 16 | Ht 72.0 in | Wt 150.0 lb

## 2021-07-05 DIAGNOSIS — M25551 Pain in right hip: Secondary | ICD-10-CM | POA: Insufficient documentation

## 2021-07-05 DIAGNOSIS — M170 Bilateral primary osteoarthritis of knee: Secondary | ICD-10-CM | POA: Insufficient documentation

## 2021-07-05 DIAGNOSIS — M25552 Pain in left hip: Secondary | ICD-10-CM

## 2021-07-05 DIAGNOSIS — G894 Chronic pain syndrome: Secondary | ICD-10-CM | POA: Insufficient documentation

## 2021-07-05 DIAGNOSIS — M7918 Myalgia, other site: Secondary | ICD-10-CM | POA: Diagnosis not present

## 2021-07-05 DIAGNOSIS — M533 Sacrococcygeal disorders, not elsewhere classified: Secondary | ICD-10-CM

## 2021-07-05 DIAGNOSIS — G8929 Other chronic pain: Secondary | ICD-10-CM | POA: Diagnosis present

## 2021-07-05 MED ORDER — GABAPENTIN 300 MG PO CAPS
300.0000 mg | ORAL_CAPSULE | Freq: Three times a day (TID) | ORAL | 2 refills | Status: DC
Start: 2021-07-05 — End: 2022-03-20

## 2021-07-05 NOTE — Progress Notes (Signed)
PROVIDER NOTE: Information contained herein reflects review and annotations entered in association with encounter. Interpretation of such information and data should be left to medically-trained personnel. Information provided to patient can be located elsewhere in the medical record under "Patient Instructions". Document created using STT-dictation technology, any transcriptional errors that may result from process are unintentional.    Patient: Lawrence Pham  Service Category: E/M  Provider: Gillis Santa, MD  DOB: [birth year]  DOS: 07/05/2021  Specialty: Interventional Pain Management  MRN: [id number]  Setting: Ambulatory outpatient  PCP: Pcp, No  Type: Established Patient    Referring Provider: No ref. provider found  Location: Office  Delivery: Face-to-face     HPI  Mr. Lawrence Pham, a 40 y.o. year old male, is here today because of his Bilateral primary osteoarthritis of knee [M17.0]. [name] primary complain today is Back Pain (lower) and Knee Pain Last encounter: My last encounter with him was on 12/13/2020. Pertinent problems: [name] has Chronic pain syndrome; Chronic bilateral low back pain with bilateral sciatica; Cervicalgia; Bilateral primary osteoarthritis of knee; Bilateral hip pain; Chronic SI joint pain; and Chronic lumbar radiculopathy on their pertinent problem list. Pain Assessment: Severity of Chronic pain is reported as a 7 /10. Location: Back Lower/buttocks bilateral down back of legaround to top of foot, effects great toe. Onset: More than a month ago. Quality: Aching, Burning, Discomfort, Throbbing, Tingling, Stabbing, Radiating. Timing: Intermittent. Modifying factor(s):  Marland Kitchen Vitals:  height is 6' (1.829 m) and weight is 150 lb (68 kg). His temperature is 96.9 F (36.1 C) (abnormal). His blood pressure is 104/84 and his pulse is 112 (abnormal). His respiration is 16 and oxygen saturation is 98%.   Reason for encounter:   [name] presents today for increased bilateral  knee pain.  This is related to knee osteoarthritis.  Of note he is status post bilateral genicular nerve block on 11/20/2020 which provided him with 75% pain relief for 4 days.  We have discussed genicular nerve radiofrequency ablation and he is now ready to move forward with this.  Risk and benefits of this procedure were discussed and I reviewed that we will start with his left knee followed by his right knee 2 weeks later.  We will plan on doing this with moderate sedation.  I will also refill his gabapentin as below.  No change in dose.   ROS  Constitutional: Denies any fever or chills Gastrointestinal: No reported hemesis, hematochezia, vomiting, or acute GI distress Musculoskeletal:  +LBP and bilateral knee pain Neurological: No reported episodes of acute onset apraxia, aphasia, dysarthria, agnosia, amnesia, paralysis, loss of coordination, or loss of consciousness  Medication Review  EPINEPHrine, alfuzosin, azelastine, fluticasone, gabapentin, and glycopyrrolate  History Review  Allergy: [name] is allergic to nucynta [tapentadol] and tramadol. Drug: [name]  reports no history of drug use. Alcohol:  reports no history of alcohol use. Tobacco:  reports that he has never smoked. He uses smokeless tobacco. Social: Mr. Swiderski  reports that he has never smoked. He uses smokeless tobacco. He reports that he does not drink alcohol and does not use drugs. Medical:  has a past medical history of Allergy and Asthma. Surgical: [name]  has no past surgical history on file. Family: family history is not on file.  Laboratory Chemistry Profile   Renal No results found for: BUN, CREATININE, LABCREA, BCR, GFR, GFRAA, GFRNONAA, LABVMA, EPIRU, EPINEPH24HUR, NOREPRU, NOREPI24HUR, DOPARU, [id number]  Hepatic No results found for: AST, ALT, ALBUMIN, ALKPHOS, HCVAB,  AMYLASE, LIPASE, AMMONIA  Electrolytes No results found for: NA, K, CL, CALCIUM, MG, PHOS  Bone No results found for: VD25OH,  [id number], HO1224MG5, [id number], 25OHVITD1, 25OHVITD2, 25OHVITD3, TESTOFREE, TESTOSTERONE  Inflammation (CRP: Acute Phase) (ESR: Chronic Phase) No results found for: CRP, ESRSEDRATE, LATICACIDVEN       Note: Above Lab results reviewed.    Physical Exam  General appearance: Well nourished, well developed, and well hydrated. In no apparent acute distress Mental status: Alert, oriented x 3 (person, place, & time)       Respiratory: No evidence of acute respiratory distress Eyes: PERLA Vitals: BP 104/84   Pulse (!) 112   Temp (!) 96.9 F (36.1 C)   Resp 16   Ht 6' (1.829 m)   Wt 150 lb (68 kg)   SpO2 98%   BMI 20.34 kg/m  BMI: Estimated body mass index is 20.34 kg/m as calculated from the following:   Height as of this encounter: 6' (1.829 m).   Weight as of this encounter: 150 lb (68 kg). Ideal: Ideal body weight: 77.6 kg (171 lb 1.2 oz)    Lumbar Spine Area Exam  Skin & Axial Inspection: No masses, redness, or swelling Alignment: Symmetrical Functional ROM: Pain restricted ROM       Stability: No instability detected Muscle Tone/Strength: Functionally intact. No obvious neuro-muscular anomalies detected. Sensory (Neurological): Articular pain pattern Right hip arthralgia   Provocative Tests: Hyperextension/rotation test: (+) bilaterally for facet joint pain. Lumbar quadrant test (Kemp's test): deferred today       Lateral bending test: (+) ipsilateral radicular pain, bilaterally. Positive for bilateral foraminal stenosis. Patrick's Maneuver: (+) for right-sided S-I arthralgia and for right hip arthralgia FABER* test: (+) for right-sided S-I arthralgia and for right hip arthralgia S-I anterior distraction/compression test: deferred today         S-I lateral compression test: deferred today         S-I Thigh-thrust test: deferred today         S-I Gaenslen's test: deferred today         *(Flexion, ABduction and External Rotation)     Gait & Posture Assessment   Ambulation: Patient ambulates using a cane Gait: Limited. Using assistive device to ambulate Posture: Difficulty standing up straight, due to pain   Lower Extremity Exam      Side: Right lower extremity   Side: Left lower extremity  Stability: No instability observed           Stability: No instability observed          Skin & Extremity Inspection: Skin color, temperature, and hair growth are WNL. No peripheral edema or cyanosis. No masses, redness, swelling, asymmetry, or associated skin lesions. No contractures.   Skin & Extremity Inspection: Skin color, temperature, and hair growth are WNL. No peripheral edema or cyanosis. No masses, redness, swelling, asymmetry, or associated skin lesions. No contractures.  Functional ROM: Pain restricted ROM for hip joint           Functional ROM: Unrestricted ROM                  Muscle Tone/Strength: Functionally intact. No obvious neuro-muscular anomalies detected.   Muscle Tone/Strength: Functionally intact. No obvious neuro-muscular anomalies detected.  Sensory (Neurological): Arthropathic arthralgia         Sensory (Neurological): Unimpaired        DTR: Patellar: deferred today Achilles: deferred today Plantar: deferred today   DTR: Patellar: deferred today Achilles: deferred today  Plantar: deferred today  Palpation: No palpable anomalies   Palpation: No palpable anomalies      Assessment   Status Diagnosis  Controlled Controlled Controlled 1. Bilateral primary osteoarthritis of knee   2. Piriformis muscle pain   3. Bilateral hip pain   4. Chronic SI joint pain   5. Chronic pain syndrome       Plan of Care   Mr. FABIAN WALDER has a current medication list which includes the following long-term medication(s): azelastine, fluticasone, glycopyrrolate, and gabapentin.  Pharmacotherapy (Medications Ordered): Meds ordered this encounter  Medications   gabapentin (NEURONTIN) 300 MG capsule    Sig: Take 1 capsule (300 mg total) by  mouth 3 (three) times daily.    Dispense:  90 capsule    Refill:  2    Orders:  Orders Placed This Encounter  Procedures   Radiofrequency,Genicular    Standing Status:   Future    Standing Expiration Date:   07/05/2022    Scheduling Instructions:     Side(s):LEFT     Level(s): Superior-Lateral, Superior-Medial, and Inferior-Medial Genicular Nerve(s)     Sedation: moderate sedation     Scheduling Timeframe: As soon as pre-approved    Order Specific Question:   Where will this procedure be performed?    Answer:   [facility]    Follow-up plan:   Return in about 2 weeks (around 07/19/2021) for Left Genicular RFA , moderate sedation (ECT suite).     Status post right sacroiliac joint injection, right piriformis injection Helped significantly, repeat as needed.  09/18/20:  bilateral knee intra-articular steroid: Not helpful.  Bilateral genicular nerve block 11/20/2020.  Consider repeating SI joint/piriformis injection every 2 to 3 months        Recent Visits No visits were found meeting these conditions. Showing recent visits within past 90 days and meeting all other requirements Today's Visits Date Type Provider Dept  07/05/21 Office Visit Gillis Santa, MD [facility]  Showing today's visits and meeting all other requirements Future Appointments No visits were found meeting these conditions. Showing future appointments within next 90 days and meeting all other requirements I discussed the assessment and treatment plan with the patient. The patient was provided an opportunity to ask questions and all were answered. The patient agreed with the plan and demonstrated an understanding of the instructions.  Patient advised to call back or seek an in-person evaluation if the symptoms or condition worsens.  Duration of encounter: 59mnutes.  Note by: BGillis Santa MD Date: 07/05/2021; Time: 9:18 AM

## 2021-07-05 NOTE — Progress Notes (Signed)
Safety precautions to be maintained throughout the outpatient stay will include: orient to surroundings, keep bed in low position, maintain call bell within reach at all times, provide assistance with transfer out of bed and ambulation.  

## 2021-07-05 NOTE — Patient Instructions (Addendum)
Preparing for Procedure with Sedation Instructions: Oral Intake: Do not eat or drink anything for at least 8 hours prior to your procedure. Transportation: Public transportation is not allowed. Bring an adult driver. The driver must be physically present in our waiting room before any procedure can be started. Physical Assistance: Bring an adult capable of physically assisting you, in the event you need help. Blood Pressure Medicine: Take your blood pressure medicine with a sip of water the morning of the procedure. Insulin: Take only  of your normal insulin dose. Preventing infections: Shower with an antibacterial soap the morning of your procedure. Build-up your immune system: Take 1000 mg of Vitamin C with every meal (3 times a day) the day prior to your procedure. Pregnancy: If you are pregnant, call and cancel the procedure. Sickness: If you have a cold, fever, or any active infections, call and cancel the procedure. Arrival: You must be in the facility at least 30 minutes prior to your scheduled procedure. Children: Do not bring children with you. Dress appropriately: Bring dark clothing that you would not mind if they get stained. Valuables: Do not bring any jewelry or valuables. Procedure appointments are reserved for interventional treatments only. No Prescription Refills. No medication changes will be discussed during procedure appointments. No disability issues will be discussed. Radiofrequency Lesioning Radiofrequency lesioning is a procedure that is performed to relieve pain. The procedure is often used for back, neck, or arm pain. Radiofrequency lesioning involves the use of a machine that creates radio waves to make heat. During the procedure, the heat is applied to the nerve that carries the pain signal. The heat damages the nerve and interferes with the pain signal. Pain relief usuallystarts about 2 weeks after the procedure and lasts for 6 months to 1 year. You will be awake  during the procedure. You will need to be able to talk withthe health care provider during the procedure. Tell a health care provider about: Any allergies you have. All medicines you are taking, including vitamins, herbs, eye drops, creams, and over-the-counter medicines. Any problems you or family members have had with anesthetic medicines. Any blood disorders you have. Any surgeries you have had. Any medical conditions you have or have had. Whether you are pregnant or may be pregnant. What are the risks? Generally, this is a safe procedure. However, problems may occur, including: Pain or soreness at the injection site. Allergic reaction to medicines given during the procedure. Bleeding. Infection at the injection site. Damage to nerves or blood vessels. What happens before the procedure? Staying hydrated Follow instructions from your health care provider about hydration, which may include: Up to 2 hours before the procedure - you may continue to drink clear liquids, such as water, clear fruit juice, black coffee, and plain tea. Eating and drinking Follow instructions from your health care provider about eating and drinking, which may include: 8 hours before the procedure - stop eating heavy meals or foods, such as meat, fried foods, or fatty foods. 6 hours before the procedure - stop eating light meals or foods, such as toast or cereal. 6 hours before the procedure - stop drinking milk or drinks that contain milk. 2 hours before the procedure - stop drinking clear liquids. Medicines Ask your health care provider about: Changing or stopping your regular medicines. This is especially important if you are taking diabetes medicines or blood thinners. Taking medicines such as aspirin and ibuprofen. These medicines can thin your blood. Do not take these medicines unless  your health care provider tells you to take them. Taking over-the-counter medicines, vitamins, herbs, and  supplements. General instructions Plan to have someone take you home from the hospital or clinic. If you will be going home right after the procedure, plan to have someone with you for 24 hours. Ask your health care provider what steps will be taken to help prevent infection. These may include: Removing hair at the procedure site. Washing skin with a germ-killing soap. Taking antibiotic medicine. What happens during the procedure?  An IV will be inserted into one of your veins. You will be given one or more of the following: A medicine to help you relax (sedative). A medicine to numb the area (local anesthetic). Your health care provider will insert a radiofrequency needle into the area to be treated. This is done with the help of a type of X-ray (fluoroscopy). A wire that carries the radio waves (electrode) will be put through the radiofrequency needle. An electrical pulse will be sent through the electrode to verify the correct nerve that is causing your pain. You will feel a tingling sensation, and you may have muscle twitching. The tissue around the needle tip will be heated by an electric current that comes from the radiofrequency machine. This will numb the nerves. The needle will be removed. A bandage (dressing) will be put on the insertion area. The procedure may vary among health care providers and hospitals. What happens after the procedure? Your blood pressure, heart rate, breathing rate, and blood oxygen level will be monitored until you leave the hospital or clinic. Return to your normal activities as told by your health care provider. Ask your health care provider what activities are safe for you. Do not drive for 24 hours if you were given a sedative during your procedure. Summary Radiofrequency lesioning is a procedure that is performed to relieve pain. The procedure is often used for back, neck, or arm pain. Radiofrequency lesioning involves the use of a machine that  creates radio waves to make heat. Plan to have someone take you home from the hospital or clinic. Do not drive for 24 hours if you were given a sedative during your procedure. Return to your normal activities as told by your health care provider. Ask your health care provider what activities are safe for you. This information is not intended to replace advice given to you by your health care provider. Make sure you discuss any questions you have with your healthcare provider. Document Revised: 07/23/2018 Document Reviewed: 07/23/2018 Elsevier Patient Education  2022 [employer].

## 2021-08-01 ENCOUNTER — Ambulatory Visit (HOSPITAL_BASED_OUTPATIENT_CLINIC_OR_DEPARTMENT_OTHER): Payer: Medicare Other | Admitting: Student in an Organized Health Care Education/Training Program

## 2021-08-01 ENCOUNTER — Ambulatory Visit
Admission: RE | Admit: 2021-08-01 | Discharge: 2021-08-01 | Disposition: A | Discharge status: Home / Self Care | Payer: Medicare Other | Source: Ambulatory Visit | Attending: Student in an Organized Health Care Education/Training Program | Admitting: Student in an Organized Health Care Education/Training Program

## 2021-08-01 ENCOUNTER — Other Ambulatory Visit: Payer: Self-pay

## 2021-08-01 VITALS — BP 97/65 | HR 65 | Temp 97.2°F | Resp 16 | Ht 72.0 in | Wt 150.0 lb

## 2021-08-01 DIAGNOSIS — G894 Chronic pain syndrome: Secondary | ICD-10-CM

## 2021-08-01 DIAGNOSIS — M170 Bilateral primary osteoarthritis of knee: Secondary | ICD-10-CM | POA: Diagnosis not present

## 2021-08-01 MED ORDER — FENTANYL CITRATE (PF) 100 MCG/2ML IJ SOLN
25.0000 ug | INTRAMUSCULAR | Status: DC | PRN
  Administered 2021-08-01: 100 ug via INTRAVENOUS

## 2021-08-01 MED ORDER — LIDOCAINE HCL (PF) 2 % IJ SOLN
INTRAMUSCULAR | Status: AC
  Filled 2021-08-01: qty 10

## 2021-08-01 MED ORDER — LIDOCAINE HCL 2 % IJ SOLN
20.0000 mL | Freq: Once | INTRAMUSCULAR | Status: AC
  Administered 2021-08-01: 100 mg

## 2021-08-01 MED ORDER — FENTANYL CITRATE (PF) 100 MCG/2ML IJ SOLN
INTRAMUSCULAR | Status: AC
  Filled 2021-08-01: qty 2

## 2021-08-01 MED ORDER — DEXAMETHASONE SODIUM PHOSPHATE 10 MG/ML IJ SOLN
INTRAMUSCULAR | Status: AC
  Filled 2021-08-01: qty 1

## 2021-08-01 MED ORDER — MIDAZOLAM HCL 5 MG/5ML IJ SOLN
0.5000 mg | Freq: Once | INTRAMUSCULAR | Status: AC
  Administered 2021-08-01: 1 mg via INTRAVENOUS
  Filled 2021-08-01: qty 5

## 2021-08-01 MED ORDER — ROPIVACAINE HCL 2 MG/ML IJ SOLN
9.0000 mL | Freq: Once | INTRAMUSCULAR | Status: AC
  Administered 2021-08-01: 9 mL via PERINEURAL

## 2021-08-01 MED ORDER — ROPIVACAINE HCL 2 MG/ML IJ SOLN
INTRAMUSCULAR | Status: AC
  Filled 2021-08-01: qty 20

## 2021-08-01 MED ORDER — DEXAMETHASONE SODIUM PHOSPHATE 10 MG/ML IJ SOLN
10.0000 mg | Freq: Once | INTRAMUSCULAR | Status: AC
  Administered 2021-08-01: 10 mg

## 2021-08-01 NOTE — Patient Instructions (Addendum)
Post-procedure Information What to expect: Most procedures involve the use of a local anesthetic (numbing medicine), and a steroid (anti-inflammatory medicine).  The local anesthetics may cause temporary numbness and weakness of the legs or arms, depending on the location of the block. This numbness/weakness may last 4-6 hours, depending on the local anesthetic used. In rare instances, it can last up to 24 hours. While numb, you must be very careful not to injure the extremity.  After any procedure, you could expect the pain to get better within 15-20 minutes. This relief is temporary and may last 4-6 hours. Once the local anesthetics wears off, you could experience discomfort, possibly more than usual, for up to 10 (ten) days. In the case of radiofrequencies, it may last up to 6 weeks. Surgeries may take up to 8 weeks for the healing process. The discomfort is due to the irritation caused by needles going through skin and muscle. To minimize the discomfort, we recommend using ice the first day, and heat from then on. The ice should be applied for 15 minutes on, and 15 minutes off. Keep repeating this cycle until bedtime. Avoid applying the ice directly to the skin, to prevent frostbite. Heat should be used daily, until the pain improves (4-10 days). Be careful not to burn yourself.  Occasionally you may experience muscle spasms or cramps. These occur as a consequence of the irritation caused by the needle sticks to the muscle and the blood that will inevitably be lost into the surrounding muscle tissue. Blood tends to be very irritating to tissues, which tend to react by going into spasm. These spasms may start the same day of your procedure, but they may also take days to develop. This late onset type of spasm or cramp is usually caused by electrolyte imbalances triggered by the steroids, at the level of the kidney. Cramps and spasms tend to respond well to muscle relaxants, multivitamins (some are  triggered by the procedure, but may have their origins in vitamin deficiencies), and "Gatorade", or any sports drinks that can replenish any electrolyte imbalances. (If you are a diabetic, ask your pharmacist to get you a sugar-free brand.) Warm showers or baths may also be helpful. Stretching exercises are highly recommended. General Instructions:  Be alert for signs of possible infection: redness, swelling, heat, red streaks, elevated temperature, and/or fever. These typically appear 4 to 6 days after the procedure. Immediately notify your doctor if you experience unusual bleeding, difficulty breathing, or loss of bowel or bladder control. If you experience increased pain, do not increase your pain medicine intake, unless instructed by your pain physician. Post-Procedure Care:  Be careful in moving about. Muscle spasms in the area of the injection may occur. Applying ice or heat to the area is often helpful. The incidence of spinal headaches after epidural injections ranges between 1.4% and 6%. If you develop a headache that does not seem to respond to conservative therapy, please let your physician know. This can be treated with an epidural blood patch.   Post-procedure numbness or redness is to be expected, however it should average 4 to 6 hours. If numbness and weakness of your extremities begins to develop 4 to 6 hours after your procedure, and is felt to be progressing and worsening, immediately contact your physician.   Diet:  If you experience nausea, do not eat until this sensation goes away. If you had a "Stellate Ganglion Block" for upper extremity "Reflex Sympathetic Dystrophy", do not eat or drink until your   hoarseness goes away. In any case, always start with liquids first and if you tolerate them well, then slowly progress to more solid foods. Activity:  For the first 4 to 6 hours after the procedure, use caution in moving about as you may experience numbness and/or weakness. Use caution in  cooking, using household electrical appliances, and climbing steps. If you need to reach your Doctor call our office: [phone] Monday-Thursday 8:00 am - 4:00 PM    Fridays: Closed     In case of an emergency: In case of emergency, call 911 or go to the nearest emergency room and have the physician there call us.  Interpretation of Procedure Every nerve block has two components: a diagnostic component, and a treatment component. Unrealistic expectations are the most common causes of "perceived failure".  In a perfect world, a single nerve block should be able to completely and permanently eliminate the pain. Sadly, the world is not perfect.  Most pain management nerve blocks are performed using local anesthetics and steroids. Steroids are responsible for any long-term benefit that you may experience. Their purpose is to decrease any chronic swelling that may exist in the area. Steroids begin to work immediately after being injected. However, most patients will not experience any benefits until 5 to 10 days after the injection, when the swelling has come down to the point where they can tell a difference. Steroids will only help if there is swelling to be treated. As such, they can assist with the diagnosis. If effective, they suggest an inflammatory component to the pain, and if ineffective, they rule out inflammation as the main cause or component of the problem. If the problem is one of mechanical compression, you will get no benefit from those steroids.   In the case of local anesthetics, they have a crucial role in the diagnosis of your condition. Most will begin to work within15 to 20 minutes after injection. The duration will depend on the type used (short- vs. Long-acting). It is of outmost importance that patients keep tract of their pain, after the procedure. To assist with this matter, a "Post-procedure Pain Diary" is provided. Make sure to complete it and to bring it back to your  follow-up appointment.  As long as the patient keeps accurate, detailed records of their symptoms after every procedure, and returns to have those interpreted, every procedure will provide us with invaluable information. Even a block that does not provide the patient with any relief, will always provide us with information about the mechanism and the origin of the pain. The only time a nerve block can be considered a waste of time is when patients do not keep track of the results, or do not keep their post-procedure appointment.  Reporting the results back to your physician The Pain Score  Pain is a subjective complaint. It cannot be seen, touched, or measured. We depend entirely on the patient's report of the pain in order to assess your condition and treatment. To evaluate the pain, we use a pain scale, where "0" means "No Pain", and a "10" is "the worst possible pain that you can even imagine" (i.e. something like been eaten alive by a shark or being torn apart by a lion).   You will frequently be asked to rate your pain. Please be as accurate, remember that medical decisions will be based on your responses. Please do not rate your pain above a 10. Doing so is actually interpreted as "symptom magnification" (exaggeration), as   well as lack of understanding with regards to the scale. To put this into perspective, when you tell us that your pain is at a 10 (ten), what you are saying is that there is nothing we can do to make this pain any worse. (Carefully think about that.) Preparing for Procedure with Sedation Instructions: . Oral Intake: Do not eat or drink anything for at least 8 hours prior to your procedure. . Transportation: Public transportation is not allowed. Bring an adult driver. The driver must be physically present in our waiting room before any procedure can be started. . Physical Assistance: Bring an adult capable of physically assisting you, in the event you need help. . Blood Pressure  Medicine: Take your blood pressure medicine with a sip of water the morning of the procedure. . Insulin: Take only  of your normal insulin dose. . Preventing infections: Shower with an antibacterial soap the morning of your procedure. . Build-up your immune system: Take 1000 mg of Vitamin C with every meal (3 times a day) the day prior to your procedure. . Pregnancy: If you are pregnant, call and cancel the procedure. . Sickness: If you have a cold, fever, or any active infections, call and cancel the procedure. . Arrival: You must be in the facility at least 30 minutes prior to your scheduled procedure. . Children: Do not bring children with you. . Dress appropriately: Bring dark clothing that you would not mind if they get stained. . Valuables: Do not bring any jewelry or valuables. Procedure appointments are reserved for interventional treatments only. . No Prescription Refills. . No medication changes will be discussed during procedure appointments. . No disability issues will be discussed. .  

## 2021-08-01 NOTE — Progress Notes (Signed)
Safety precautions to be maintained throughout the outpatient stay will include: orient to surroundings, keep bed in low position, maintain call bell within reach at all times, provide assistance with transfer out of bed and ambulation.  

## 2021-08-01 NOTE — Progress Notes (Signed)
PROVIDER NOTE: Information contained herein reflects review and annotations entered in association with encounter. Interpretation of such information and data should be left to medically-trained personnel. Information provided to patient can be located elsewhere in the medical record under "Patient Instructions". Document created using STT-dictation technology, any transcriptional errors that may result from process are unintentional.    Patient: Lawrence Pham  Service Category: Procedure  Provider: Edward Jolly, MD  DOB: [birth year]  DOS: 08/01/2021  Location: [facility]  MRN: [id number]  Setting: Ambulatory - outpatient  Referring Provider: No ref. provider found  Type: Established Patient  Specialty: Interventional Pain Management  PCP: Pcp, No   Primary Reason for Visit: Interventional Pain Management Treatment. CC: Knee Pain    Procedure:          Anesthesia, Analgesia, Anxiolysis:  Type: Therapeutic Superolateral, Superomedial, and Inferomedial, Genicular Nerve Radiofrequency Ablation (destruction).            Region: Lateral, Anterior, and Medial aspects of the knee joint, above and below the knee joint proper. Level: Superior and inferior to the knee joint. Laterality: Left  Type:  moderate sedation Local Anesthetic: Lidocaine 1-2% Sedation:  moderate   Indication(s): Anxiety & Analgesia Route: Intravenous (IV) IV Access: Secured   Position: Supine   Indications: 1. Bilateral primary osteoarthritis of knee   2. Chronic pain syndrome    Mr. Rodriques has been dealing with the above chronic pain for longer than three months and has either failed to respond, was unable to tolerate, or simply did not get enough benefit from other more conservative therapies including, but not limited to: 1. Over-the-counter medications 2. Anti-inflammatory medications 3. Muscle relaxants 4. Membrane stabilizers 5. Opioids 6. Physical therapy and/or chiropractic manipulation 7.  Modalities (Heat, ice, etc.) 8. Invasive techniques such as nerve blocks. Mr. Defenbaugh has attained more than 50% relief of the pain from a series of diagnostic injections conducted in separate occasions.  Pain Score: Pre-procedure: 9 /10 Post-procedure: 0-No pain/10    Pre-op H&P Assessment:  Mr. Bruney is a 40 y.o. (year old), male patient, seen today for interventional treatment. He  has no past surgical history on file. Mr. Catino has a current medication list which includes the following prescription(s): alfuzosin, azelastine, epinephrine, fluticasone, gabapentin, and glycopyrrolate, and the following Facility-Administered Medications: dexamethasone, fentanyl, fentanyl, lidocaine hcl (pf), and ropivacaine (pf) 2 mg/ml (0.2%). His primarily concern today is the Knee Pain  Initial Vital Signs:  Pulse/HCG Rate: 65ECG Heart Rate: 62 Temp: (!) 97.2 F (36.2 C) Resp: 16 BP: (!) 106/52 SpO2: 100 %  BMI: Estimated body mass index is 20.34 kg/m as calculated from the following:   Height as of this encounter: 6' (1.829 m).   Weight as of this encounter: 150 lb (68 kg).  Risk Assessment: Allergies: Reviewed. He is allergic to nucynta [tapentadol] and tramadol.  Allergy Precautions: None required Coagulopathies: Reviewed. None identified.  Blood-thinner therapy: None at this time Active Infection(s): Reviewed. None identified. Mr. Sender is afebrile  Site Confirmation: Mr. Mcgowen was asked to confirm the procedure and laterality before marking the site Procedure checklist: Completed Consent: Before the procedure and under the influence of no sedative(s), amnesic(s), or anxiolytics, the patient was informed of the treatment options, risks and possible complications. To fulfill our ethical and legal obligations, as recommended by the American Medical Association's Code of Ethics, I have informed the patient of my clinical impression; the nature and purpose of the treatment or procedure; the  risks, benefits,  and possible complications of the intervention; the alternatives, including doing nothing; the risk(s) and benefit(s) of the alternative treatment(s) or procedure(s); and the risk(s) and benefit(s) of doing nothing. The patient was provided information about the general risks and possible complications associated with the procedure. These may include, but are not limited to: failure to achieve desired goals, infection, bleeding, organ or nerve damage, allergic reactions, paralysis, and death. In addition, the patient was informed of those risks and complications associated to the procedure, such as failure to decrease pain; infection; bleeding; organ or nerve damage with subsequent damage to sensory, motor, and/or autonomic systems, resulting in permanent pain, numbness, and/or weakness of one or several areas of the body; allergic reactions; (i.e.: anaphylactic reaction); and/or death. Furthermore, the patient was informed of those risks and complications associated with the medications. These include, but are not limited to: allergic reactions (i.e.: anaphylactic or anaphylactoid reaction(s)); adrenal axis suppression; blood sugar elevation that in diabetics may result in ketoacidosis or comma; water retention that in patients with history of congestive heart failure may result in shortness of breath, pulmonary edema, and decompensation with resultant heart failure; weight gain; swelling or edema; medication-induced neural toxicity; particulate matter embolism and blood vessel occlusion with resultant organ, and/or nervous system infarction; and/or aseptic necrosis of one or more joints. Finally, the patient was informed that Medicine is not an exact science; therefore, there is also the possibility of unforeseen or unpredictable risks and/or possible complications that may result in a catastrophic outcome. The patient indicated having understood very clearly. We have given the patient no  guarantees and we have made no promises. Enough time was given to the patient to ask questions, all of which were answered to the patient's satisfaction. Mr. Mazer has indicated that he wanted to continue with the procedure. Attestation: I, the ordering provider, attest that I have discussed with the patient the benefits, risks, side-effects, alternatives, likelihood of achieving goals, and potential problems during recovery for the procedure that I have provided informed consent. Date  Time: 08/01/2021  7:55 AM  Pre-Procedure Preparation:  Monitoring: As per clinic protocol. Respiration, ETCO2, SpO2, BP, heart rate and rhythm monitor placed and checked for adequate function Safety Precautions: Patient was assessed for positional comfort and pressure points before starting the procedure. Time-out: I initiated and conducted the "Time-out" before starting the procedure, as per protocol. The patient was asked to participate by confirming the accuracy of the "Time Out" information. Verification of the correct person, site, and procedure were performed and confirmed by me, the nursing staff, and the patient. "Time-out" conducted as per Joint Commission's Universal Protocol (UP.01.01.01). Time: 0847  Description of Procedure:          Target Area: For Genicular Nerve radiofrequency ablation (destruction), the targets are: the superolateral genicular nerve, located in the lateral distal portion of the femoral shaft as it curves to form the lateral epicondyle, in the region of the distal femoral metaphysis; the superomedial genicular nerve, located in the medial distal portion of the femoral shaft as it curves to form the medial epicondyle; and the inferomedial genicular nerve, located in the medial, proximal portion of the tibial shaft, as it curves to form the medial epicondyle, in the region of the proximal tibial metaphysis. Approach: Anterior, ipsilateral approach. Area Prepped: Entire knee area, from  mid-thigh to mid-shin, lateral, anterior, and medial aspects. DuraPrep (Iodine Povacrylex [0.7% available iodine] and Isopropyl Alcohol, 74% w/w) Safety Precautions: Aspiration looking for blood return was conducted prior to  all injections. At no point did we inject any substances, as a needle was being advanced. No attempts were made at seeking any paresthesias. Safe injection practices and needle disposal techniques used. Medications properly checked for expiration dates. SDV (single dose vial) medications used. Description of the Procedure: Protocol guidelines were followed. The patient was placed in position over the procedure table. The target area was identified and the area prepped in the usual manner. The skin and muscle were infiltrated with local anesthetic. Appropriate amount of time allowed to pass for local anesthetics to take effect. Radiofrequency needles were introduced to the target area using fluoroscopic guidance. Using the NeuroTherm NT1100 Radiofrequency Generator, sensory stimulation using 50 Hz was used to locate & identify the nerve, making sure that the needle was positioned such that there was no sensory stimulation below 0.3 V or above 0.7 V. Stimulation using 2 Hz was used to evaluate the motor component. Care was taken not to lesion any nerves that demonstrated motor stimulation of the lower extremities at an output of less than 2.5 times that of the sensory threshold, or a maximum of 2.0 V. Once satisfactory placement of the needles was achieved, the numbing solution was slowly injected after negative aspiration. After waiting for at least 2 minutes, the ablation was performed at 80 degrees C for 60 seconds, using regular Radiofrequency settings. Once the procedure was completed, the needles were then removed and the area cleansed, making sure to leave some of the prepping solution back to take advantage of its long term bactericidal properties. Intra-operative Compliance:  Compliant  Vitals:   08/01/21 0907 08/01/21 0917 08/01/21 0928 08/01/21 0937  BP: 114/83 96/66 99/74  97/65  Pulse:      Resp: 18 16 16 16   Temp:      SpO2: 97% 96% 96% 97%  Weight:      Height:        Start Time: 0847 hrs. End Time: 0907 hrs. Materials & Medications:  Needle(s) Type: Teflon-coated, curved tip, Radiofrequency needle(s) Gauge: 22G Length: 10cm Medication(s): Please see orders for medications and dosing details. 9 cc solution made of 8 cc of 0.2% Ropivacaine, 1 cc of Decadron 10 mg/cc.  3 cc injected at each level above after sensorimotor testing, prior to ablation. Imaging Guidance (Non-Spinal):          Type of Imaging Technique: Fluoroscopy Guidance (Non-Spinal) Indication(s): Assistance in needle guidance and placement for procedures requiring needle placement in or near specific anatomical locations not easily accessible without such assistance. Exposure Time: Please see nurses notes. Contrast: Before injecting any contrast, we confirmed that the patient did not have an allergy to iodine, shellfish, or radiological contrast. Once satisfactory needle placement was completed at the desired level, radiological contrast was injected. Contrast injected under live fluoroscopy. No contrast complications. See chart for type and volume of contrast used. Fluoroscopic Guidance: I was personally present during the use of fluoroscopy. "Tunnel Vision Technique" used to obtain the best possible view of the target area. Parallax error corrected before commencing the procedure. "Direction-depth-direction" technique used to introduce the needle under continuous pulsed fluoroscopy. Once target was reached, antero-posterior, oblique, and lateral fluoroscopic projection used confirm needle placement in all planes. Images permanently stored in EMR. Interpretation: I personally interpreted the imaging intraoperatively. Adequate needle placement confirmed in multiple planes. Appropriate spread  of contrast into desired area was observed. No evidence of afferent or efferent intravascular uptake. Permanent images saved into the patient's record.    Post-operative Assessment:  Post-procedure Vital Signs:  Pulse/HCG Rate: 65(!) 55 Temp:  (!) 97.2 F (36.2 C) Resp: 16 BP:  97/65 SpO2: 97 %  EBL: None  Complications: No immediate post-treatment complications observed by team, or reported by patient.  Note: The patient tolerated the entire procedure well. A repeat set of vitals were taken after the procedure and the patient was kept under observation following institutional policy, for this type of procedure. Post-procedural neurological assessment was performed, showing return to baseline, prior to discharge. The patient was provided with post-procedure discharge instructions, including a section on how to identify potential problems. Should any problems arise concerning this procedure, the patient was given instructions to immediately contact us, at any time, without hesitation. In any case, we plan to contact the patient by telephone for a follow-up status report regarding this interventional procedure.  Comments:  No additional relevant information.  Plan of Care  Orders:  Orders Placed This Encounter  Procedures   Radiofrequency,Genicular    Standing Status:   Future    Standing Expiration Date:   01/29/2022    Scheduling Instructions:     RIGHT GN RFA w sedation    Order Specific Question:   Where will this procedure be performed?    Answer:   [facility]   DG [facility] C-ARM 1-60 MIN NO REPORT    Intraoperative interpretation by procedural physician at [facility].    Standing Status:   Standing    Number of Occurrences:   1    Order Specific Question:   Reason for exam:    Answer:   Assistance in needle guidance and placement for procedures requiring needle placement in or near specific anatomical locations not easily accessible without such  assistance.    Medications ordered for procedure: Meds ordered this encounter  Medications   lidocaine (XYLOCAINE) 2 % (with pres) injection 400 mg   midazolam (VERSED) 5 MG/5ML injection 0.5-2 mg    Make sure Flumazenil is available in the pyxis when using this medication. If oversedation occurs, administer 0.2 mg IV over 15 sec. If after 45 sec no response, administer 0.2 mg again over 1 min; may repeat at 1 min intervals; not to exceed 4 doses (1 mg)   dexamethasone (DECADRON) injection 10 mg   ropivacaine (PF) 2 mg/mL (0.2%) (NAROPIN) injection 9 mL   fentaNYL (SUBLIMAZE) injection 25-50 mcg    Make sure Narcan is available in the pyxis when using this medication. In the event of respiratory depression (RR< 8/min): Titrate NARCAN (naloxone) in increments of 0.1 to 0.2 mg IV at 2-3 minute intervals, until desired degree of reversal.   lidocaine HCl (PF) (XYLOCAINE) 2 % injection    Shatley, Jennifer   : cabinet override   ropivacaine (PF) 2 mg/mL (0.2%) (NAROPIN) 2 MG/ML injection    Shatley, Jennifer   : cabinet override   dexamethasone (DECADRON) 10 MG/ML injection    Shatley, Jennifer   : cabinet override   fentaNYL (SUBLIMAZE) 100 MCG/2ML injection    Shatley, Jennifer   : cabinet override   Medications administered: We administered lidocaine, midazolam, dexamethasone, ropivacaine (PF) 2 mg/mL (0.2%), and fentaNYL.  See the medical record for exact dosing, route, and time of administration.  Follow-up plan:   Return in about 19 days (around 08/20/2021) for Right GN RFA , moderate sedation (ECT suite).       Status post right sacroiliac joint injection, right piriformis injection Helped significantly, repeat as needed.  09/18/20:  bilateral  knee intra-articular steroid: Not helpful.  Bilateral genicular nerve block 11/20/2020.  Consider repeating SI joint/piriformis injection every 2 to 3 months. Left GN RFA 08/01/21         Recent Visits Date Type Provider Dept  07/05/21 Office  Visit Edward Jolly, MD [facility]  Showing recent visits within past 90 days and meeting all other requirements Today's Visits Date Type Provider Dept  08/01/21 Procedure visit Edward Jolly, MD [facility]  Showing today's visits and meeting all other requirements Future Appointments Date Type Provider Dept  08/22/21 Appointment Edward Jolly, MD [facility]  Showing future appointments within next 90 days and meeting all other requirements Disposition: Discharge home  Discharge (Date  Time): 08/01/2021; 0938 hrs.   Primary Care Physician: Pcp, No Location: [facility] Note by: Edward Jolly, MD Date: 08/01/2021; Time: 10:41 AM  Disclaimer:  Medicine is not an exact science. The only guarantee in medicine is that nothing is guaranteed. It is important to note that the decision to proceed with this intervention was based on the information collected from the patient. The Data and conclusions were drawn from the patient's questionnaire, the interview, and the physical examination. Because the information was provided in large part by the patient, it cannot be guaranteed that it has not been purposely or unconsciously manipulated. Every effort has been made to obtain as much relevant data as possible for this evaluation. It is important to note that the conclusions that lead to this procedure are derived in large part from the available data. Always take into account that the treatment will also be dependent on availability of resources and existing treatment guidelines, considered by other Pain Management Practitioners as being common knowledge and practice, at the time of the intervention. For Medico-Legal purposes, it is also important to point out that variation in procedural techniques and pharmacological choices are the acceptable norm. The indications, contraindications, technique, and results of the above procedure should only be  interpreted and judged by a Board-Certified Interventional Pain Specialist with extensive familiarity and expertise in the same exact procedure and technique.

## 2021-08-02 ENCOUNTER — Telehealth: Payer: Self-pay | Admitting: *Deleted

## 2021-08-02 NOTE — Telephone Encounter (Signed)
Attempted to call for post procedure follow-up. Message left. 

## 2021-08-21 ENCOUNTER — Telehealth: Payer: Self-pay | Admitting: *Deleted

## 2021-08-21 ENCOUNTER — Encounter: Payer: Self-pay | Admitting: Student in an Organized Health Care Education/Training Program

## 2021-08-21 NOTE — Telephone Encounter (Signed)
Attempted to call for pre appointment review of allergies/meds. Message left. 

## 2021-08-22 ENCOUNTER — Ambulatory Visit
Payer: Medicare Other | Attending: Student in an Organized Health Care Education/Training Program | Admitting: Student in an Organized Health Care Education/Training Program

## 2021-08-22 ENCOUNTER — Other Ambulatory Visit: Payer: Self-pay

## 2021-08-22 DIAGNOSIS — G894 Chronic pain syndrome: Secondary | ICD-10-CM

## 2021-08-22 DIAGNOSIS — M170 Bilateral primary osteoarthritis of knee: Secondary | ICD-10-CM | POA: Diagnosis not present

## 2021-08-22 NOTE — Progress Notes (Signed)
Patient: Lawrence Pham  Service Category: E/M  Provider: Edward Jolly, MD  DOB: June 05, 1981  DOS: 08/22/2021  Location: Office  MRN: [id number]  Setting: Ambulatory outpatient  Referring Provider: No ref. provider found  Type: Established Patient  Specialty: Interventional Pain Management  PCP: Pcp, No  Location: Home  Delivery: TeleHealth     Virtual Encounter - Pain Management PROVIDER NOTE: Information contained herein reflects review and annotations entered in association with encounter. Interpretation of such information and data should be left to medically-trained personnel. Information provided to patient can be located elsewhere in the medical record under "Patient Instructions". Document created using STT-dictation technology, any transcriptional errors that may result from process are unintentional.    Contact & Pharmacy Preferred: [phone] Home: [phone] (home) Mobile: [phone] (mobile) E-mail: No e-mail address on record  [id number] [place], [place] - [address] [place] [address] Phone: [phone] Fax: [phone]   Pre-screening  Lawrence Pham offered "in-person" vs "virtual" encounter. He indicated preferring virtual for this encounter.   Reason COVID-19*  Social distancing based on CDC and AMA recommendations.   I contacted Lawrence Pham on 08/22/2021 via telephone.      I clearly identified myself as Edward Jolly, MD. I verified that I was speaking with the correct person using two identifiers (Name: Lawrence Pham, and date of birth: 01-25-1981).  Consent I sought verbal advanced consent from Lawrence Pham for virtual visit interactions. I informed Lawrence Pham of possible security and privacy concerns, risks, and limitations associated with providing "not-in-person" medical evaluation and management services. I also informed Lawrence Pham of the availability of "in-person" appointments. Finally, I informed him that there would be  a charge for the virtual visit and that he could be  personally, fully or partially, financially responsible for it. Lawrence Pham expressed understanding and agreed to proceed.   Historic Elements   Lawrence Pham is a 40 y.o. year old, male patient evaluated today after our last contact on 08/01/2021. Lawrence Pham  has a past medical history of Allergy and Asthma. He also  has no past surgical history on file. Lawrence Pham has a current medication list which includes the following prescription(s): alfuzosin, azelastine, epinephrine, fluticasone, gabapentin, and glycopyrrolate. He  reports that he has never smoked. He uses smokeless tobacco. He reports that he does not drink alcohol and does not use drugs. Lawrence Pham is allergic to nucynta [tapentadol] and tramadol.   HPI  Today, he is being contacted for a post-procedure assessment.   Post-Procedure Evaluation  Procedure (08/01/2021):  LEFT Genicular RFA  Anxiolysis: Please see nurses note.  Effectiveness during initial hour after procedure (Ultra-Short Term Relief): 100 %   Local anesthetic used: Long-acting (4-6 hours) Effectiveness: Defined as any analgesic benefit obtained secondary to the administration of local anesthetics. This carries significant diagnostic value as to the etiological location, or anatomical origin, of the pain. Duration of benefit is expected to coincide with the duration of the local anesthetic used.  Effectiveness during initial 4-6 hours after procedure (Short-Term Relief): 100 %   Long-term benefit: Defined as any relief past the pharmacologic duration of the local anesthetics.  Effectiveness past the initial 6 hours after procedure (Long-Term Relief): 30 %   Benefits, current: Defined as benefit present at the time of this evaluation.   Analgesia:  25-30% Function: improved   Assessment  The primary encounter diagnosis was Bilateral primary osteoarthritis of knee. A diagnosis of Chronic  pain syndrome was also  pertinent to this visit.  Plan of Care   Patient is endorsing analgesic benefit and functional improvement, he is here to bear weight on his left knee after his genicular nerve radiofrequency ablation.  He continues to have good days and bad days but overall feels that his knee pain has improved since the RFA.  He would like to move forward with a right genicular nerve RFA.  We will plan on doing this with sedation.  Order placed below.  Orders:  Orders Placed This Encounter  Procedures   Radiofrequency,Genicular    Standing Status:   Future    Standing Expiration Date:   08/22/2022    Scheduling Instructions:     Side(s): RIGHT     Level(s): Superior-Lateral, Superior-Medial, and Inferior-Medial Genicular Nerve(s)     Sedation: with     Scheduling Timeframe: As soon as pre-approved    Order Specific Question:   Where will this procedure be performed?    Answer:   [facility]    Follow-up plan:   Return in about 2 weeks (around 09/05/2021) for right GN RFA , moderate sedation (ECT suite).     Status post right sacroiliac joint injection, right piriformis injection Helped significantly, repeat as needed.  09/18/20:  bilateral knee intra-articular steroid: Not helpful.  Bilateral genicular nerve block 11/20/2020.  Consider repeating SI joint/piriformis injection every 2 to 3 months. Left GN RFA 08/01/21          Recent Visits Date Type Provider Dept  08/01/21 Procedure visit Edward Jolly, MD [facility]  07/05/21 Office Visit Edward Jolly, MD [facility]  Showing recent visits within past 90 days and meeting all other requirements Today's Visits Date Type Provider Dept  08/22/21 Office Visit Edward Jolly, MD [facility]  Showing today's visits and meeting all other requirements Future Appointments No visits were found meeting these conditions. Showing future appointments within next 90 days and meeting all other requirements I discussed the  assessment and treatment plan with the patient. The patient was provided an opportunity to ask questions and all were answered. The patient agreed with the plan and demonstrated an understanding of the instructions.  Patient advised to call back or seek an in-person evaluation if the symptoms or condition worsens.  Duration of encounter: .  Note by: Edward Jolly, MD Date: 08/22/2021; Time: 2:54 PM

## 2021-09-05 ENCOUNTER — Ambulatory Visit: Payer: Medicare Other | Admitting: Student in an Organized Health Care Education/Training Program

## 2021-09-19 ENCOUNTER — Ambulatory Visit: Payer: Medicare Other | Admitting: Student in an Organized Health Care Education/Training Program

## 2021-09-26 ENCOUNTER — Ambulatory Visit
Admission: RE | Admit: 2021-09-26 | Discharge: 2021-09-26 | Disposition: A | Discharge status: Home / Self Care | Payer: Medicare Other | Source: Ambulatory Visit | Attending: Student in an Organized Health Care Education/Training Program | Admitting: Student in an Organized Health Care Education/Training Program

## 2021-09-26 ENCOUNTER — Ambulatory Visit (HOSPITAL_BASED_OUTPATIENT_CLINIC_OR_DEPARTMENT_OTHER): Payer: Medicare Other | Admitting: Student in an Organized Health Care Education/Training Program

## 2021-09-26 ENCOUNTER — Encounter: Payer: Self-pay | Admitting: Student in an Organized Health Care Education/Training Program

## 2021-09-26 ENCOUNTER — Other Ambulatory Visit: Payer: Self-pay

## 2021-09-26 VITALS — BP 101/78 | HR 85 | Temp 97.1°F | Resp 15 | Ht 72.0 in | Wt 140.0 lb

## 2021-09-26 DIAGNOSIS — M170 Bilateral primary osteoarthritis of knee: Secondary | ICD-10-CM | POA: Diagnosis not present

## 2021-09-26 DIAGNOSIS — G894 Chronic pain syndrome: Secondary | ICD-10-CM | POA: Insufficient documentation

## 2021-09-26 MED ORDER — DEXAMETHASONE SODIUM PHOSPHATE 10 MG/ML IJ SOLN
INTRAMUSCULAR | Status: AC
  Filled 2021-09-26: qty 1

## 2021-09-26 MED ORDER — DEXAMETHASONE SODIUM PHOSPHATE 10 MG/ML IJ SOLN
10.0000 mg | Freq: Once | INTRAMUSCULAR | Status: AC
  Administered 2021-09-26: 10 mg

## 2021-09-26 MED ORDER — LIDOCAINE HCL (PF) 2 % IJ SOLN
INTRAMUSCULAR | Status: AC
  Filled 2021-09-26: qty 5

## 2021-09-26 MED ORDER — FENTANYL CITRATE (PF) 100 MCG/2ML IJ SOLN
INTRAMUSCULAR | Status: AC
  Filled 2021-09-26: qty 2

## 2021-09-26 MED ORDER — FENTANYL CITRATE (PF) 100 MCG/2ML IJ SOLN
25.0000 ug | INTRAMUSCULAR | Status: DC | PRN
  Administered 2021-09-26: 100 ug via INTRAVENOUS

## 2021-09-26 MED ORDER — MIDAZOLAM HCL 2 MG/2ML IJ SOLN: 1.0000 mg | INTRAMUSCULAR | Status: DC | PRN

## 2021-09-26 MED ORDER — ROPIVACAINE HCL 2 MG/ML IJ SOLN
9.0000 mL | Freq: Once | INTRAMUSCULAR | Status: AC
  Administered 2021-09-26: 9 mL via PERINEURAL

## 2021-09-26 MED ORDER — DICLOFENAC SODIUM 75 MG PO TBEC
75.0000 mg | DELAYED_RELEASE_TABLET | Freq: Two times a day (BID) | ORAL | 1 refills | Status: DC | PRN
Start: 2021-09-26 — End: 2022-03-20

## 2021-09-26 MED ORDER — MIDAZOLAM BOLUS VIA INFUSION
1.0000 mg | INTRAVENOUS | Status: DC | PRN
  Filled 2021-09-26: qty 2

## 2021-09-26 MED ORDER — LIDOCAINE HCL 2 % IJ SOLN
20.0000 mL | Freq: Once | INTRAMUSCULAR | Status: AC
  Administered 2021-09-26: 100 mg

## 2021-09-26 MED ORDER — MIDAZOLAM HCL 5 MG/5ML IJ SOLN
INTRAMUSCULAR | Status: AC
  Filled 2021-09-26: qty 5

## 2021-09-26 NOTE — Progress Notes (Signed)
9179 Wasted 4mg  Versed in stericycle with .

## 2021-09-26 NOTE — Progress Notes (Signed)
Safety precautions to be maintained throughout the outpatient stay will include: orient to surroundings, keep bed in low position, maintain call bell within reach at all times, provide assistance with transfer out of bed and ambulation.  

## 2021-09-26 NOTE — Patient Instructions (Addendum)
Please print above exercise for pt   __________________________________________________________________________________________  Post-Radiofrequency (RF) Discharge Instructions  You have just completed a Radiofrequency Neurotomy.  The following instructions will provide you with information and guidelines for self-care upon discharge.  If at any time you have questions or concerns please call your physician. DO NOT DRIVE YOURSELF!!  Instructions: Apply ice: Fill a plastic sandwich bag with crushed ice. Cover it with a small towel and apply to injection site. Apply for 15 minutes then remove x 15 minutes. Repeat sequence on day of procedure, until you go to bed. The purpose is to minimize swelling and discomfort after procedure. Apply heat: Apply heat to procedure site starting the day following the procedure. The purpose is to treat any soreness and discomfort from the procedure. Food intake: No eating limitations, unless stipulated above.  Nevertheless, if you have had sedation, you may experience some nausea.  In this case, it may be wise to wait at least two hours prior to resuming regular diet. Physical activities: Keep activities to a minimum for the first 8 hours after the procedure. For the first 24 hours after the procedure, do not drive a motor vehicle,  Operate heavy machinery, power tools, or handle any weapons.  Consider walking with the use of an assistive device or accompanied by an adult for the first 24 hours.  Do not drink alcoholic beverages including beer.  Do not make any important decisions or sign any legal documents. Go home and rest today.  Resume activities tomorrow, as tolerated.  Use caution in moving about as you may experience mild leg weakness.  Use caution in cooking, use of household electrical appliances and climbing steps. Driving: If you have received any sedation, you are not allowed to drive for 24 hours after your procedure. Blood thinner: Restart your blood  thinner 6 hours after your procedure. (Only for those taking blood thinners) Insulin: As soon as you can eat, you may resume your normal dosing schedule. (Only for those taking insulin) Medications: May resume pre-procedure medications.  Do not take any drugs, other than what has been prescribed to you. Infection prevention: Keep procedure site clean and dry. Post-procedure Pain Diary: Extremely important that this be done correctly and accurately. Recorded information will be used to determine the next step in treatment. Pain evaluated is that of treated area only. Do not include pain from an untreated area. Complete every hour, on the hour, for the initial 8 hours. Set an alarm to help you do this part accurately. Do not go to sleep and have it completed later. It will not be accurate. Follow-up appointment: Keep your follow-up appointment after the procedure. Usually 2-6 weeks after radiofrequency. Bring you pain diary. The information collected will be essential for your long-term care.   Expect: From numbing medicine (AKA: Local Anesthetics): Numbness or decrease in pain. Onset: Full effect within 15 minutes of injected. Duration: It will depend on the type of local anesthetic used. On the average, 1 to 8 hours.  From steroids (when added): Decrease in swelling or inflammation. Once inflammation is improved, relief of the pain will follow. Onset of benefits: Depends on the amount of swelling present. The more swelling, the longer it will take for the benefits to be seen. In some cases, up to 10 days. Duration: Steroids will stay in the system x 2 weeks. Duration of benefits will depend on multiple posibilities including persistent irritating factors. From procedure: Some discomfort is to be expected once the numbing medicine  wears off. In the case of radiofrequency procedures, this may last as long as 6 weeks. Additional post-procedure pain medication is provided for this. Discomfort is  minimized if ice and heat are applied as instructed.  Call if: You experience numbness and weakness that gets worse with time, as opposed to wearing off. He experience any unusual bleeding, difficulty breathing, or loss of the ability to control your bowel and bladder. (This applies to Spinal procedures only) You experience any redness, swelling, heat, red streaks, elevated temperature, fever, or any other signs of a possible infection.  Emergency Numbers: Durning business hours (Monday - Thursday, 8:00 AM - 4:00 PM) (Friday, 9:00 AM - 12:00 Noon): [phone] After hours: [phone] ____________________________________________________________________________________________

## 2021-09-26 NOTE — Progress Notes (Signed)
PROVIDER NOTE: Information contained herein reflects review and annotations entered in association with encounter. Interpretation of such information and data should be left to medically-trained personnel. Information provided to patient can be located elsewhere in the medical record under "Patient Instructions". Document created using STT-dictation technology, any transcriptional errors that may result from process are unintentional.    Patient: Lawrence Pham  Service Category: Procedure  Provider: Edward Jolly, MD  DOB: 1981-05-10  DOS: 09/26/2021  Location: [facility]  MRN: [id number]  Setting: Ambulatory - outpatient  Referring Provider: No ref. provider found  Type: Established Patient  Specialty: Interventional Pain Management  PCP: Pcp, No   Primary Reason for Visit: Interventional Pain Management Treatment. CC: Knee Pain    Procedure:          Anesthesia, Analgesia, Anxiolysis:  Type: Therapeutic Superolateral, Superomedial, and Inferomedial, Genicular Nerve Radiofrequency Ablation (destruction).            Region: Lateral, Anterior, and Medial aspects of the knee joint, above and below the knee joint proper. Level: Superior and inferior to the knee joint. Laterality: Right  Type:  moderate sedation Local Anesthetic: Lidocaine 1-2% Sedation:  moderate   Indication(s): Anxiety & Analgesia Route: Intravenous (IV) IV Access: Secured   Position: Supine   Indications: 1. Bilateral primary osteoarthritis of knee   2. Chronic pain syndrome    Lawrence Pham has been dealing with the above chronic pain for longer than three months and has either failed to respond, was unable to tolerate, or simply did not get enough benefit from other more conservative therapies including, but not limited to: 1. Over-the-counter medications 2. Anti-inflammatory medications 3. Muscle relaxants 4. Membrane stabilizers 5. Opioids 6. Physical therapy and/or chiropractic manipulation 7.  Modalities (Heat, ice, etc.) 8. Invasive techniques such as nerve blocks. Lawrence Pham has attained more than 50% relief of the pain from a series of diagnostic injections conducted in separate occasions.  Pain Score: Pre-procedure: 8 /10 Post-procedure: 7 /10    Pre-op H&P Assessment:  Lawrence Pham is a 40 y.o. (year old), male patient, seen today for interventional treatment. He  has no past surgical history on file. Lawrence Pham has a current medication list which includes the following prescription(s): alfuzosin, azelastine, diclofenac, epinephrine, fluticasone, gabapentin, and glycopyrrolate, and the following Facility-Administered Medications: fentanyl and midazolam. His primarily concern today is the Knee Pain  Initial Vital Signs:  Pulse/HCG Rate: 85ECG Heart Rate: 62 Temp: (!) 97.1 F (36.2 C) Resp: 16 BP: 102/64 SpO2: 100 %  BMI: Estimated body mass index is 18.99 kg/m as calculated from the following:   Height as of this encounter: 6' (1.829 m).   Weight as of this encounter: 140 lb (63.5 kg).  Risk Assessment: Allergies: Reviewed. He is allergic to nucynta [tapentadol] and tramadol.  Allergy Precautions: None required Coagulopathies: Reviewed. None identified.  Blood-thinner therapy: None at this time Active Infection(s): Reviewed. None identified. Lawrence Pham is afebrile  Site Confirmation: Lawrence Pham was asked to confirm the procedure and laterality before marking the site Procedure checklist: Completed Consent: Before the procedure and under the influence of no sedative(s), amnesic(s), or anxiolytics, the patient was informed of the treatment options, risks and possible complications. To fulfill our ethical and legal obligations, as recommended by the American Medical Association's Code of Ethics, I have informed the patient of my clinical impression; the nature and purpose of the treatment or procedure; the risks, benefits, and possible complications of the intervention;  the alternatives, including  doing nothing; the risk(s) and benefit(s) of the alternative treatment(s) or procedure(s); and the risk(s) and benefit(s) of doing nothing. The patient was provided information about the general risks and possible complications associated with the procedure. These may include, but are not limited to: failure to achieve desired goals, infection, bleeding, organ or nerve damage, allergic reactions, paralysis, and death. In addition, the patient was informed of those risks and complications associated to the procedure, such as failure to decrease pain; infection; bleeding; organ or nerve damage with subsequent damage to sensory, motor, and/or autonomic systems, resulting in permanent pain, numbness, and/or weakness of one or several areas of the body; allergic reactions; (i.e.: anaphylactic reaction); and/or death. Furthermore, the patient was informed of those risks and complications associated with the medications. These include, but are not limited to: allergic reactions (i.e.: anaphylactic or anaphylactoid reaction(s)); adrenal axis suppression; blood sugar elevation that in diabetics may result in ketoacidosis or comma; water retention that in patients with history of congestive heart failure may result in shortness of breath, pulmonary edema, and decompensation with resultant heart failure; weight gain; swelling or edema; medication-induced neural toxicity; particulate matter embolism and blood vessel occlusion with resultant organ, and/or nervous system infarction; and/or aseptic necrosis of one or more joints. Finally, the patient was informed that Medicine is not an exact science; therefore, there is also the possibility of unforeseen or unpredictable risks and/or possible complications that may result in a catastrophic outcome. The patient indicated having understood very clearly. We have given the patient no guarantees and we have made no promises. Enough time was given to the  patient to ask questions, all of which were answered to the patient's satisfaction. Lawrence Pham has indicated that he wanted to continue with the procedure. Attestation: I, the ordering provider, attest that I have discussed with the patient the benefits, risks, side-effects, alternatives, likelihood of achieving goals, and potential problems during recovery for the procedure that I have provided informed consent. Date  Time: 09/26/2021  8:11 AM  Pre-Procedure Preparation:  Monitoring: As per clinic protocol. Respiration, ETCO2, SpO2, BP, heart rate and rhythm monitor placed and checked for adequate function Safety Precautions: Patient was assessed for positional comfort and pressure points before starting the procedure. Time-out: I initiated and conducted the "Time-out" before starting the procedure, as per protocol. The patient was asked to participate by confirming the accuracy of the "Time Out" information. Verification of the correct person, site, and procedure were performed and confirmed by me, the nursing staff, and the patient. "Time-out" conducted as per Joint Commission's Universal Protocol (UP.01.01.01). Time: 9147  Description of Procedure:          Target Area: For Genicular Nerve radiofrequency ablation (destruction), the targets are: the superolateral genicular nerve, located in the lateral distal portion of the femoral shaft as it curves to form the lateral epicondyle, in the region of the distal femoral metaphysis; the superomedial genicular nerve, located in the medial distal portion of the femoral shaft as it curves to form the medial epicondyle; and the inferomedial genicular nerve, located in the medial, proximal portion of the tibial shaft, as it curves to form the medial epicondyle, in the region of the proximal tibial metaphysis. Approach: Anterior, ipsilateral approach. Area Prepped: Entire knee area, from mid-thigh to mid-shin, lateral, anterior, and medial aspects. DuraPrep  (Iodine Povacrylex [0.7% available iodine] and Isopropyl Alcohol, 74% w/w) Safety Precautions: Aspiration looking for blood return was conducted prior to all injections. At no point did we inject any  substances, as a needle was being advanced. No attempts were made at seeking any paresthesias. Safe injection practices and needle disposal techniques used. Medications properly checked for expiration dates. SDV (single dose vial) medications used. Description of the Procedure: Protocol guidelines were followed. The patient was placed in position over the procedure table. The target area was identified and the area prepped in the usual manner. The skin and muscle were infiltrated with local anesthetic. Appropriate amount of time allowed to pass for local anesthetics to take effect. Radiofrequency needles were introduced to the target area using fluoroscopic guidance. Using the NeuroTherm NT1100 Radiofrequency Generator, sensory stimulation using 50 Hz was used to locate & identify the nerve, making sure that the needle was positioned such that there was no sensory stimulation below 0.3 V or above 0.7 V. Stimulation using 2 Hz was used to evaluate the motor component. Care was taken not to lesion any nerves that demonstrated motor stimulation of the lower extremities at an output of less than 2.5 times that of the sensory threshold, or a maximum of 2.0 V. Once satisfactory placement of the needles was achieved, the numbing solution was slowly injected after negative aspiration. After waiting for at least 2 minutes, the ablation was performed at 80 degrees C for 60 seconds, using regular Radiofrequency settings. Once the procedure was completed, the needles were then removed and the area cleansed, making sure to leave some of the prepping solution back to take advantage of its long term bactericidal properties. Intra-operative Compliance: Compliant  Vitals:   09/26/21 0905 09/26/21 0915 09/26/21 0925 09/26/21 0935   BP: 108/76 100/71 99/80 101/78  Pulse:      Resp: 18 16 16 15   Temp:      TempSrc:      SpO2: 97% 98% 99% 100%  Weight:      Height:        Start Time: 0846 hrs. End Time: 0904 hrs. Materials & Medications:  Needle(s) Type: Teflon-coated, curved tip, Radiofrequency needle(s) Gauge: 22G Length: 10cm Medication(s): Please see orders for medications and dosing details. 9 cc solution made of 8 cc of 0.2% Ropivacaine, 1 cc of Decadron 10 mg/cc.  3 cc injected at each level above on right  after sensorimotor testing, prior to ablation. Imaging Guidance (Non-Spinal):          Type of Imaging Technique: Fluoroscopy Guidance (Non-Spinal) Indication(s): Assistance in needle guidance and placement for procedures requiring needle placement in or near specific anatomical locations not easily accessible without such assistance. Exposure Time: Please see nurses notes. Contrast: Before injecting any contrast, we confirmed that the patient did not have an allergy to iodine, shellfish, or radiological contrast. Once satisfactory needle placement was completed at the desired level, radiological contrast was injected. Contrast injected under live fluoroscopy. No contrast complications. See chart for type and volume of contrast used. Fluoroscopic Guidance: I was personally present during the use of fluoroscopy. "Tunnel Vision Technique" used to obtain the best possible view of the target area. Parallax error corrected before commencing the procedure. "Direction-depth-direction" technique used to introduce the needle under continuous pulsed fluoroscopy. Once target was reached, antero-posterior, oblique, and lateral fluoroscopic projection used confirm needle placement in all planes. Images permanently stored in EMR. Interpretation: I personally interpreted the imaging intraoperatively. Adequate needle placement confirmed in multiple planes. Appropriate spread of contrast into desired area was observed. No  evidence of afferent or efferent intravascular uptake. Permanent images saved into the patient's record.  Post-operative Assessment:  Post-procedure Vital  Signs:  Pulse/HCG Rate: 8572 Temp:  (!) 97.1 F (36.2 C) Resp: 15 BP:  101/78 SpO2: 100 %  EBL: None  Complications: No immediate post-treatment complications observed by team, or reported by patient.  Note: The patient tolerated the entire procedure well. A repeat set of vitals were taken after the procedure and the patient was kept under observation following institutional policy, for this type of procedure. Post-procedural neurological assessment was performed, showing return to baseline, prior to discharge. The patient was provided with post-procedure discharge instructions, including a section on how to identify potential problems. Should any problems arise concerning this procedure, the patient was given instructions to immediately contact us, at any time, without hesitation. In any case, we plan to contact the patient by telephone for a follow-up status report regarding this interventional procedure.  Comments:  No additional relevant information.  Plan of Care  Orders:  Orders Placed This Encounter  Procedures   DG PAIN [facility] C-ARM 1-60 MIN NO REPORT    Intraoperative interpretation by procedural physician at [facility].    Standing Status:   Standing    Number of Occurrences:   1    Order Specific Question:   Reason for exam:    Answer:   Assistance in needle guidance and placement for procedures requiring needle placement in or near specific anatomical locations not easily accessible without such assistance.     Medications ordered for procedure: Meds ordered this encounter  Medications   lidocaine (XYLOCAINE) 2 % (with pres) injection 400 mg   DISCONTD: midazolam (VERSED) bolus via infusion 1-2 mg    Make sure Flumazenil is available in the pyxis when using this medication. If oversedation occurs,  administer 0.2 mg IV over 15 sec. If after 45 sec no response, administer 0.2 mg again over 1 min; may repeat at 1 min intervals; not to exceed 4 doses (1 mg)   fentaNYL (SUBLIMAZE) injection 25-50 mcg    Make sure Narcan is available in the pyxis when using this medication. In the event of respiratory depression (RR< 8/min): Titrate NARCAN (naloxone) in increments of 0.1 to 0.2 mg IV at 2-3 minute intervals, until desired degree of reversal.   dexamethasone (DECADRON) injection 10 mg   ropivacaine (PF) 2 mg/mL (0.2%) (NAROPIN) injection 9 mL   diclofenac (VOLTAREN) 75 MG EC tablet    Sig: Take 1 tablet (75 mg total) by mouth 2 (two) times daily as needed for moderate pain (knee pain).    Dispense:  60 tablet    Refill:  1   midazolam (VERSED) injection 1-2 mg    Make sure Flumazenil is available in the pyxis when using this medication. If oversedation occurs, administer 0.2 mg IV over 15 sec. If after 45 sec no response, administer 0.2 mg again over 1 min; may repeat at 1 min intervals; not to exceed 4 doses (1 mg)    Medications administered: We administered lidocaine, fentaNYL, dexamethasone, and ropivacaine (PF) 2 mg/mL (0.2%).  See the medical record for exact dosing, route, and time of administration.  Follow-up plan:   Return in about 9 weeks (around 11/28/2021) for Post Procedure Evaluation, virtual.       Status post right sacroiliac joint injection, right piriformis injection Helped significantly, repeat as needed.  09/18/20:  bilateral knee intra-articular steroid: Not helpful.  Bilateral genicular nerve block 11/20/2020.  Consider repeating SI joint/piriformis injection every 2 to 3 months. Left GN RFA 08/01/21, right genicular RFA 09/26/2021  Recent Visits Date Type Provider Dept  08/22/21 Office Visit Edward Jolly, MD [facility]  08/01/21 Procedure visit Edward Jolly, MD [facility]  07/05/21 Office Visit Edward Jolly, MD [facility]   Showing recent visits within past 90 days and meeting all other requirements Today's Visits Date Type Provider Dept  09/26/21 Procedure visit Edward Jolly, MD [facility]  Showing today's visits and meeting all other requirements Future Appointments Date Type Provider Dept  11/28/21 Appointment Edward Jolly, MD [facility]  Showing future appointments within next 90 days and meeting all other requirements Disposition: Discharge home  Discharge (Date  Time): 09/26/2021; 0936 hrs.   Primary Care Physician: Pcp, No Location: [facility] Note by: Edward Jolly, MD Date: 09/26/2021; Time: 10:59 AM  Disclaimer:  Medicine is not an exact science. The only guarantee in medicine is that nothing is guaranteed. It is important to note that the decision to proceed with this intervention was based on the information collected from the patient. The Data and conclusions were drawn from the patient's questionnaire, the interview, and the physical examination. Because the information was provided in large part by the patient, it cannot be guaranteed that it has not been purposely or unconsciously manipulated. Every effort has been made to obtain as much relevant data as possible for this evaluation. It is important to note that the conclusions that lead to this procedure are derived in large part from the available data. Always take into account that the treatment will also be dependent on availability of resources and existing treatment guidelines, considered by other Pain Management Practitioners as being common knowledge and practice, at the time of the intervention. For Medico-Legal purposes, it is also important to point out that variation in procedural techniques and pharmacological choices are the acceptable norm. The indications, contraindications, technique, and results of the above procedure should only be interpreted and judged by a Board-Certified  Interventional Pain Specialist with extensive familiarity and expertise in the same exact procedure and technique.

## 2021-09-27 ENCOUNTER — Telehealth: Payer: Self-pay | Admitting: *Deleted

## 2021-09-27 NOTE — Telephone Encounter (Signed)
Attempted to call for post procedure follow-up. Message left. 

## 2021-11-18 ENCOUNTER — Other Ambulatory Visit: Payer: Self-pay | Admitting: Student in an Organized Health Care Education/Training Program

## 2021-11-28 ENCOUNTER — Encounter: Payer: Self-pay | Admitting: Student in an Organized Health Care Education/Training Program

## 2021-11-28 ENCOUNTER — Ambulatory Visit
Payer: Medicare Other | Attending: Student in an Organized Health Care Education/Training Program | Admitting: Student in an Organized Health Care Education/Training Program

## 2021-11-28 ENCOUNTER — Other Ambulatory Visit: Payer: Self-pay

## 2021-11-28 DIAGNOSIS — F321 Major depressive disorder, single episode, moderate: Secondary | ICD-10-CM | POA: Diagnosis not present

## 2021-11-28 DIAGNOSIS — M170 Bilateral primary osteoarthritis of knee: Secondary | ICD-10-CM | POA: Diagnosis not present

## 2021-11-28 DIAGNOSIS — G894 Chronic pain syndrome: Secondary | ICD-10-CM

## 2021-11-28 NOTE — Progress Notes (Signed)
Patient: Lawrence Pham  Service Category: E/M  Provider: Edward Jolly, MD  DOB: December 28, 1980  DOS: 11/28/2021  Location: Office  MRN: [id number]  Setting: Ambulatory outpatient  Referring Provider: No ref. provider found  Type: Established Patient  Specialty: Interventional Pain Management  PCP: Pcp, No  Location: Remote location  Delivery: TeleHealth     Virtual Encounter - Pain Management PROVIDER NOTE: Information contained herein reflects review and annotations entered in association with encounter. Interpretation of such information and data should be left to medically-trained personnel. Information provided to patient can be located elsewhere in the medical record under "Patient Instructions". Document created using STT-dictation technology, any transcriptional errors that may result from process are unintentional.    Contact & Pharmacy Preferred: [phone] Home: [phone] (home) Mobile: [phone] (mobile) E-mail: No e-mail address on record  [id number] [place], [place] - [address] [place] [address] Phone: [phone] Fax: [phone]   Pre-screening  Mr. [place] offered "in-person" vs "virtual" encounter. He indicated preferring virtual for this encounter.   Reason COVID-19*   Social distancing based on CDC and AMA recommendations.   I contacted [name] on 11/28/2021 via telephone.      I clearly identified myself as Edward Jolly, MD. I verified that I was speaking with the correct person using two identifiers (Name: ELLISON RIETH, and date of birth: December 03, 1980).  Consent I sought verbal advanced consent from [name] for virtual visit interactions. I informed Mr. Coleman of possible security and privacy concerns, risks, and limitations associated with providing "not-in-person" medical evaluation and management services. I also informed Mr. Whedbee of the availability of "in-person" appointments. Finally, I informed him that  there would be a charge for the virtual visit and that he could be  personally, fully or partially, financially responsible for it. Mr. Griffith expressed understanding and agreed to proceed.   Historic Elements   Mr. DARSHAN SOLANKI is a 41 y.o. year old, male patient evaluated today after our last contact on 11/18/2021. Mr. Havens  has a past medical history of Allergy and Asthma. He also  has no past surgical history on file. Mr. Novinger has a current medication list which includes the following prescription(s): alfuzosin, azelastine, diclofenac, epinephrine, fluticasone, gabapentin, glycopyrrolate, and montelukast. He  reports that he has never smoked. He uses smokeless tobacco. He reports that he does not drink alcohol and does not use drugs. Mr. Deutschman is allergic to nucynta [tapentadol] and tramadol.   HPI  Today, he is being contacted for a post-procedure assessment.   Post-procedure evaluation     Procedure:          Anesthesia, Analgesia, Anxiolysis:  Type: Therapeutic Superolateral, Superomedial, and Inferomedial, Genicular Nerve Radiofrequency Ablation (destruction).            Region: Lateral, Anterior, and Medial aspects of the knee joint, above and below the knee joint proper. Level: Superior and inferior to the knee joint. Laterality: Right  Type:  moderate sedation Local Anesthetic: Lidocaine 1-2% Sedation:  moderate   Indication(s): Anxiety & Analgesia Route: Intravenous (IV) IV Access: Secured   Position: Supine   Indications: 1. Bilateral primary osteoarthritis of knee   2. Chronic pain syndrome    Mr. Prestage has been dealing with the above chronic pain for longer than three months and has either failed to respond, was unable to tolerate, or simply did not get enough benefit from other more conservative therapies including, but not limited  to: 1. Over-the-counter medications 2. Anti-inflammatory medications 3. Muscle relaxants 4. Membrane stabilizers 5. Opioids 6.  Physical therapy and/or chiropractic manipulation 7. Modalities (Heat, ice, etc.) 8. Invasive techniques such as nerve blocks. Mr. Bledsoe has attained more than 50% relief of the pain from a series of diagnostic injections conducted in separate occasions.  Pain Score: Pre-procedure: 8 /10 Post-procedure: 7 /10     Effectiveness:  Initial hour after procedure: 100 %  Subsequent 4-6 hours post-procedure: 100 %  Analgesia past initial 6 hours: 25 % (ongoing)  Ongoing improvement:  Analgesic:  25%-30% Function: Mr. Viens reports improvement in function  Assessment  The primary encounter diagnosis was Bilateral primary osteoarthritis of knee. Diagnoses of Chronic pain syndrome and Current moderate episode of major depressive disorder, unspecified whether recurrent (HCC) were also pertinent to this visit.  Plan of Care   Moderate pain relief after right genicular RFA as above Endorsing sx of depression including sadness, isolation, and weight loss. Refer to psychiatry.    Orders:  Orders Placed This Encounter  Procedures   Ambulatory referral to Psychiatry    Referral Priority:   Routine    Referral Type:   Psychiatric    Referral Reason:   Specialty Services Required    Referred to Provider:   Jomarie Longs, MD    Requested Specialty:   Psychiatry    Number of Visits Requested:   1   Follow-up plan:   Return repeat GN RFA prn.     Status post right sacroiliac joint injection, right piriformis injection Helped significantly, repeat as needed.  09/18/20:  bilateral knee intra-articular steroid: Not helpful.  Bilateral genicular nerve block 11/20/2020.  Consider repeating SI joint/piriformis injection every 2 to 3 months. Left GN RFA 08/01/21, right genicular RFA 09/26/2021         Recent Visits Date Type Provider Dept  09/26/21 Procedure visit Edward Jolly, MD [facility]  Showing recent visits within past 90 days and meeting all other requirements Today's  Visits Date Type Provider Dept  11/28/21 Office Visit Edward Jolly, MD [facility]  Showing today's visits and meeting all other requirements Future Appointments No visits were found meeting these conditions. Showing future appointments within next 90 days and meeting all other requirements  I discussed the assessment and treatment plan with the patient. The patient was provided an opportunity to ask questions and all were answered. The patient agreed with the plan and demonstrated an understanding of the instructions.  Patient advised to call back or seek an in-person evaluation if the symptoms or condition worsens.  Duration of encounter: .  Note by: Edward Jolly, MD Date: 11/28/2021; Time: 2:21 PM

## 2022-01-11 ENCOUNTER — Other Ambulatory Visit: Payer: Self-pay

## 2022-01-11 ENCOUNTER — Encounter: Payer: Self-pay | Admitting: Psychiatry

## 2022-01-11 ENCOUNTER — Telehealth (INDEPENDENT_AMBULATORY_CARE_PROVIDER_SITE_OTHER): Payer: Medicare Other | Admitting: Psychiatry

## 2022-01-11 DIAGNOSIS — F332 Major depressive disorder, recurrent severe without psychotic features: Secondary | ICD-10-CM

## 2022-01-11 DIAGNOSIS — R634 Abnormal weight loss: Secondary | ICD-10-CM | POA: Diagnosis not present

## 2022-01-11 DIAGNOSIS — F1290 Cannabis use, unspecified, uncomplicated: Secondary | ICD-10-CM

## 2022-01-11 DIAGNOSIS — G4700 Insomnia, unspecified: Secondary | ICD-10-CM | POA: Diagnosis not present

## 2022-01-11 DIAGNOSIS — Z9189 Other specified personal risk factors, not elsewhere classified: Secondary | ICD-10-CM | POA: Insufficient documentation

## 2022-01-11 DIAGNOSIS — F1111 Opioid abuse, in remission: Secondary | ICD-10-CM

## 2022-01-11 DIAGNOSIS — F1311 Sedative, hypnotic or anxiolytic abuse, in remission: Secondary | ICD-10-CM

## 2022-01-11 DIAGNOSIS — F411 Generalized anxiety disorder: Secondary | ICD-10-CM | POA: Diagnosis not present

## 2022-01-11 DIAGNOSIS — Z79899 Other long term (current) drug therapy: Secondary | ICD-10-CM | POA: Insufficient documentation

## 2022-01-11 DIAGNOSIS — Z87898 Personal history of other specified conditions: Secondary | ICD-10-CM | POA: Insufficient documentation

## 2022-01-11 MED ORDER — MIRTAZAPINE 7.5 MG PO TABS: 7.5000 mg | ORAL_TABLET | Freq: Every day | ORAL | 1 refills | Status: DC
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
## 2022-01-29 ENCOUNTER — Ambulatory Visit: Payer: Medicare Other | Admitting: Psychiatry

## 2022-02-02 ENCOUNTER — Other Ambulatory Visit: Payer: Self-pay | Admitting: Psychiatry

## 2022-02-02 DIAGNOSIS — F332 Major depressive disorder, recurrent severe without psychotic features: Secondary | ICD-10-CM

## 2022-02-02 DIAGNOSIS — F411 Generalized anxiety disorder: Secondary | ICD-10-CM

## 2022-02-02 DIAGNOSIS — G4700 Insomnia, unspecified: Secondary | ICD-10-CM

## 2022-03-02 ENCOUNTER — Other Ambulatory Visit: Payer: Self-pay | Admitting: Student in an Organized Health Care Education/Training Program

## 2022-03-08 ENCOUNTER — Telehealth (HOSPITAL_COMMUNITY): Payer: Self-pay

## 2022-03-08 ENCOUNTER — Telehealth: Payer: Self-pay

## 2022-03-08 DIAGNOSIS — G4700 Insomnia, unspecified: Secondary | ICD-10-CM

## 2022-03-08 DIAGNOSIS — F411 Generalized anxiety disorder: Secondary | ICD-10-CM

## 2022-03-08 DIAGNOSIS — F332 Major depressive disorder, recurrent severe without psychotic features: Secondary | ICD-10-CM

## 2022-03-08 MED ORDER — MIRTAZAPINE 7.5 MG PO TABS: 7.5000 mg | ORAL_TABLET | Freq: Every day | ORAL | 0 refills | Status: DC

## 2022-03-08 NOTE — Telephone Encounter (Signed)
I have sent mirtazapine or Remeron to his pharmacy for 30-day supply. ?

## 2022-03-08 NOTE — Telephone Encounter (Signed)
pt called states he needs refills on this medication  remeron ?

## 2022-03-11 NOTE — Telephone Encounter (Signed)
This was done.

## 2022-03-18 ENCOUNTER — Encounter: Payer: Self-pay | Admitting: Psychiatry

## 2022-03-18 ENCOUNTER — Ambulatory Visit (INDEPENDENT_AMBULATORY_CARE_PROVIDER_SITE_OTHER): Payer: Medicare Other | Admitting: Psychiatry

## 2022-03-18 VITALS — BP 107/72 | HR 79 | Temp 98.7°F | Wt 124.0 lb

## 2022-03-18 DIAGNOSIS — G4700 Insomnia, unspecified: Secondary | ICD-10-CM

## 2022-03-18 DIAGNOSIS — F1290 Cannabis use, unspecified, uncomplicated: Secondary | ICD-10-CM | POA: Diagnosis not present

## 2022-03-18 DIAGNOSIS — F1111 Opioid abuse, in remission: Secondary | ICD-10-CM

## 2022-03-18 DIAGNOSIS — F1311 Sedative, hypnotic or anxiolytic abuse, in remission: Secondary | ICD-10-CM | POA: Insufficient documentation

## 2022-03-18 DIAGNOSIS — F332 Major depressive disorder, recurrent severe without psychotic features: Secondary | ICD-10-CM | POA: Diagnosis not present

## 2022-03-18 DIAGNOSIS — Z9189 Other specified personal risk factors, not elsewhere classified: Secondary | ICD-10-CM

## 2022-03-18 DIAGNOSIS — F411 Generalized anxiety disorder: Secondary | ICD-10-CM | POA: Diagnosis not present

## 2022-03-18 MED ORDER — MIRTAZAPINE 15 MG PO TABS
15.0000 mg | ORAL_TABLET | Freq: Every day | ORAL | 1 refills | Status: DC
Start: 2022-03-18 — End: 2022-04-17

## 2022-03-18 NOTE — Progress Notes (Signed)
BH MD OP Progress Note ? ?03/18/2022 11:51 AM ?Lawrence Pham  ?MRN:  [id number] ? ?Chief Complaint:  ?Chief Complaint  ?Patient presents with  ? Follow-up: 41 year old Caucasian male, with history of MDD, anxiety, chronic pain, long-term use of cannabis, presented for medication management.  ? ?HPI: Lawrence Pham is a 41 year old Caucasian male, married, disabled, lives in [place], has a history of MDD, GAD, insomnia, unintentional weight loss, long-term use of cannabis, chronic pain was evaluated in office today. ? ?Patient reports he is currently compliant on the mirtazapine.  He currently goes to bed at around midnight and is able to sleep until 5:30 in the morning.  He does wake up at 5:30 AM however is able to go back to sleep after a few minutes and stays in bed until 7:30 AM.  He gets around 6 to 7 hours of sleep right now. ? ?Patient continues to struggle with low energy, low motivation, low appetite.  Patient reports he has not noticed much benefit with his appetite on the current medication regimen. ? ?Patient was advised to establish care with a primary care provider last visit to have further evaluation into what could be causing his weight loss and appetite issues.  Patient however has not done it yet.  Agrees to follow through with recommendation. ? ?Patient denies any suicidality, homicidality or perceptual disturbances. ? ?Patient denies side effects to the mirtazapine. ? ?Denies any other concerns today. ? ?Visit Diagnosis:  ?  ICD-10-CM   ?1. Severe episode of recurrent major depressive disorder, without psychotic features (HCC)  F33.2 mirtazapine (REMERON) 15 MG tablet  ?  ?2. GAD (generalized anxiety disorder)  F41.1 mirtazapine (REMERON) 15 MG tablet  ?  ?3. Insomnia, unspecified type  G47.00 EKG 12-Lead  ?  mirtazapine (REMERON) 15 MG tablet  ?  ?4. Long term current use of cannabis  F12.90 EKG 12-Lead  ?  ?5. At risk for prolonged QT interval syndrome  Z91.89 EKG 12-Lead  ?  ?6. History of  opioid abuse (HCC)  F11.11   ?  ?7. 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  ? ? ?Past Psychiatric History: Reviewed past psychiatric history from progress note on 01/11/2022.  Past trials of medications like Effexor, Valium. ? ?Past Medical History:  ?Past Medical History:  ?Diagnosis Date  ? Allergy   ? Asthma   ? Chronic pain   ?  ?Past Surgical History:  ?Procedure Laterality Date  ? MANDIBLE SURGERY Left 2005  ? ? ?Family Psychiatric History: As noted below. ? ?Family History:  ?Family History  ?Problem Relation Age of Onset  ? Drug abuse Father   ? ?Social History: Reviewed social history from my progress note on 01/11/2022. ?Social History  ? ?Socioeconomic History  ? Marital status: Married  ?  Spouse name: Not on file  ? Number of children: 2  ? Years of education: 27  ? Highest education level: Some college, no degree  ?Occupational History  ? Not on file  ?Tobacco Use  ? Smoking status: Never  ? Smokeless tobacco: Former  ?  Types: Snuff  ?Vaping Use  ? Vaping Use: Every day  ? Substances: THC, Mixture of cannabinoids  ?Substance and Sexual Activity  ? Alcohol use: No  ? Drug use: Not Currently  ? Sexual activity: Not on file  ?Other Topics Concern  ? Not on file  ?Social History Narrative  ? Not on file  ? ?Social Determinants of Health  ? ?  Financial Resource Strain: Not on file  ?Food Insecurity: Not on file  ?Transportation Needs: Not on file  ?Physical Activity: Not on file  ?Stress: Not on file  ?Social Connections: Not on file  ? ? ?Allergies:  ?Allergies  ?Allergen Reactions  ? Nucynta [Tapentadol] Nausea And Vomiting  ? Tramadol Rash  ? ? ?Metabolic Disorder Labs: ?No results found for: HGBA1C, MPG ?No results found for: PROLACTIN ?No results found for: CHOL, TRIG, HDL, CHOLHDL, VLDL, LDLCALC ?No results found for: TSH ? ?Therapeutic Level Labs: ?No results found for: LITHIUM ?No results found for: VALPROATE ?No components found for:  CBMZ ? ?Current Medications: ?Current Outpatient  Medications  ?Medication Sig Dispense Refill  ? alfuzosin (UROXATRAL) 10 MG 24 hr tablet Take 10 mg by mouth daily.    ? azelastine (ASTELIN) 0.1 % nasal spray Place into both nostrils.    ? diclofenac (VOLTAREN) 75 MG EC tablet Take 1 tablet (75 mg total) by mouth 2 (two) times daily as needed for moderate pain (knee pain). 60 tablet 1  ? EPINEPHrine 0.3 mg/0.3 mL IJ SOAJ injection See admin instructions.    ? fluticasone (FLONASE) 50 MCG/ACT nasal spray Place into both nostrils.    ? gabapentin (NEURONTIN) 300 MG capsule Take 1 capsule (300 mg total) by mouth 3 (three) times daily. 90 capsule 2  ? glycopyrrolate (ROBINUL) 1 MG tablet Take 2 mg by mouth daily.    ? mirtazapine (REMERON) 15 MG tablet Take 1 tablet (15 mg total) by mouth at bedtime. 30 tablet 1  ? montelukast (SINGULAIR) 10 MG tablet Take 10 mg by mouth at bedtime.    ? ?No current facility-administered medications for this visit.  ? ? ? ?Musculoskeletal: ?Strength & Muscle Tone: within normal limits ?Gait & Station:  slow , walks with cane ?Patient leans: N/A ? ?Psychiatric Specialty Exam: ?Review of Systems  ?Constitutional:  Positive for appetite change.  ?Musculoskeletal:  Positive for arthralgias and back pain.  ?Psychiatric/Behavioral:  Positive for decreased concentration, dysphoric mood and sleep disturbance. The patient is nervous/anxious.   ?All other systems reviewed and are negative.  ?Blood pressure 107/72, pulse 79, temperature 98.7 ?F (37.1 ?C), temperature source Temporal, weight 124 lb (56.2 kg).Body mass index is 16.82 kg/m?.  ?General Appearance: Casual  ?Eye Contact:  Fair  ?Speech:  Clear and Coherent  ?Volume:  Normal  ?Mood:  Anxious and Depressed  ?Affect:  Appropriate  ?Thought Process:  Goal Directed and Descriptions of Associations: Intact  ?Orientation:  Full (Time, Place, and Person)  ?Thought Content: Logical   ?Suicidal Thoughts:  No  ?Homicidal Thoughts:  No  ?Memory:  Immediate;   Fair ?Recent;   Fair ?Remote;   Fair   ?Judgement:  Fair  ?Insight:  Shallow  ?Psychomotor Activity:  Normal  ?Concentration:  Concentration: Fair and Attention Span: Fair  ?Recall:  Fair  ?Fund of Knowledge: Fair  ?Language: Fair  ?Akathisia:  No  ?Handed:  Left  ?AIMS (if indicated): done  ?Assets:  Communication Skills ?Desire for Improvement ?Housing ?Intimacy ?Social Support  ?ADL's:  Intact  ?Cognition: WNL  ?Sleep:   improving  ? ?Screenings: ?AIMS   ? ?Flowsheet Row [facility] Visit from [facility]18/2022 [facility]  ?AIMS Total Score 0  ? ?  ? ?GAD-7   ? ?Flowsheet Row Video Visit from 01/11/2022 in [facility]  ?Total GAD-7 Score 15  ? ?  ? ?PHQ2-9   ? ?Flowsheet Row Office Visit from 03/18/2022 in  [facility] Video Visit from 01/11/2022 in [facility]  ?PHQ-2 Total Score 3 3  ?PHQ-9 Total Score 16 18  ? ?  ? ?Flowsheet Row Video Visit from 01/11/2022 in [facility]  ?C-SSRS RISK CATEGORY No Risk  ? ?  ? ? ? ?Assessment and Plan: RYELAN KAZEE is a 41 year old Caucasian male, married, disabled, lives in [place] has a history of depression, anxiety was evaluated in office today.  Patient noncompliant with recommendations, continues to struggle with appetite loss, weight changes, mood symptoms, will benefit from the following plan. ? ?Plan ? ?MDD-unstable ?Increase mirtazapine to 15 mg p.o. nightly ?We will consider adding a medication like Seroquel in the future however he will benefit from an EKG prior to that.  This was ordered however he has been noncompliant. ? ?GAD-unstable ?Increase mirtazapine to 15 mg p.o. nightly ? ?Insomnia-improving ?Patient will continue to benefit from sufficient pain management ?Continue mirtazapine as prescribed ? ?Weight loss-unintentional-unstable ?Ordered labs-CBC CMP TSH-pending ?Patient advised to establish care with a primary care provider. ?Patient was also referred to a  nutritionist however he could not afford it. ? ?At risk for prolonged QT syndrome-ordered EKG again.  Provided #[id number]. ? ?Long-term use of cannabis-provided counseling.  Patient reports he takes cannabis for

## 2022-03-18 NOTE — Patient Instructions (Signed)
Please call for EKG-[id number] 

## 2022-03-20 ENCOUNTER — Other Ambulatory Visit: Payer: Self-pay | Admitting: *Deleted

## 2022-03-20 ENCOUNTER — Telehealth: Payer: Self-pay | Admitting: Student in an Organized Health Care Education/Training Program

## 2022-03-20 MED ORDER — GABAPENTIN 300 MG PO CAPS: 300.0000 mg | ORAL_CAPSULE | Freq: Three times a day (TID) | ORAL | 2 refills | Status: DC

## 2022-03-20 MED ORDER — DICLOFENAC SODIUM 75 MG PO TBEC: 75.0000 mg | DELAYED_RELEASE_TABLET | Freq: Two times a day (BID) | ORAL | 1 refills | Status: DC | PRN

## 2022-03-20 NOTE — Telephone Encounter (Signed)
RX refill request sent to Dr. Cherylann Ratel. ?

## 2022-03-26 ENCOUNTER — Other Ambulatory Visit
Admission: RE | Admit: 2022-03-26 | Discharge: 2022-03-26 | Disposition: A | Discharge status: Home / Self Care | Payer: Medicare Other | Source: Ambulatory Visit | Attending: Psychiatry | Admitting: Psychiatry

## 2022-03-26 ENCOUNTER — Ambulatory Visit
Admission: RE | Admit: 2022-03-26 | Discharge: 2022-03-26 | Disposition: A | Discharge status: Home / Self Care | Payer: Medicare Other | Source: Ambulatory Visit | Attending: Psychiatry | Admitting: Psychiatry

## 2022-03-26 ENCOUNTER — Encounter: Payer: Self-pay | Admitting: Psychiatry

## 2022-03-26 DIAGNOSIS — F411 Generalized anxiety disorder: Secondary | ICD-10-CM | POA: Diagnosis not present

## 2022-03-26 DIAGNOSIS — G4710 Hypersomnia, unspecified: Secondary | ICD-10-CM | POA: Insufficient documentation

## 2022-03-26 DIAGNOSIS — F332 Major depressive disorder, recurrent severe without psychotic features: Secondary | ICD-10-CM | POA: Insufficient documentation

## 2022-03-26 DIAGNOSIS — Z79899 Other long term (current) drug therapy: Secondary | ICD-10-CM | POA: Diagnosis not present

## 2022-03-26 DIAGNOSIS — Z9189 Other specified personal risk factors, not elsewhere classified: Secondary | ICD-10-CM | POA: Insufficient documentation

## 2022-03-26 DIAGNOSIS — R634 Abnormal weight loss: Secondary | ICD-10-CM | POA: Diagnosis not present

## 2022-03-26 DIAGNOSIS — F1290 Cannabis use, unspecified, uncomplicated: Secondary | ICD-10-CM | POA: Insufficient documentation

## 2022-03-26 LAB — CBC WITH DIFFERENTIAL/PLATELET
Abs Immature Granulocytes: 0 10*3/uL (ref 0.00–0.07)
Basophils Absolute: 0 10*3/uL (ref 0.0–0.1)
Basophils Relative: 1 %
Eosinophils Absolute: 0.2 10*3/uL (ref 0.0–0.5)
Eosinophils Relative: 4 %
HCT: 44.9 % (ref 39.0–52.0)
Hemoglobin: 15.1 g/dL (ref 13.0–17.0)
Immature Granulocytes: 0 %
Lymphocytes Relative: 31 %
Lymphs Abs: 1.2 10*3/uL (ref 0.7–4.0)
MCH: 30.4 pg (ref 26.0–34.0)
MCHC: 33.6 g/dL (ref 30.0–36.0)
MCV: 90.5 fL (ref 80.0–100.0)
Monocytes Absolute: 0.3 10*3/uL (ref 0.1–1.0)
Monocytes Relative: 8 %
Neutro Abs: 2.3 10*3/uL (ref 1.7–7.7)
Neutrophils Relative %: 56 %
Platelets: 261 10*3/uL (ref 150–400)
RBC: 4.96 MIL/uL (ref 4.22–5.81)
RDW: 11.8 % (ref 11.5–15.5)
WBC: 4 10*3/uL (ref 4.0–10.5)
nRBC: 0 % (ref 0.0–0.2)

## 2022-03-26 LAB — COMPREHENSIVE METABOLIC PANEL
ALT: 15 U/L (ref 0–44)
AST: 16 U/L (ref 15–41)
Albumin: 4.3 g/dL (ref 3.5–5.0)
Alkaline Phosphatase: 53 U/L (ref 38–126)
Anion gap: 5 (ref 5–15)
BUN: 13 mg/dL (ref 6–20)
CO2: 28 mmol/L (ref 22–32)
Calcium: 9.6 mg/dL (ref 8.9–10.3)
Chloride: 106 mmol/L (ref 98–111)
Creatinine, Ser: 0.88 mg/dL (ref 0.61–1.24)
GFR, Estimated: >60 mL/min (ref >60)
Glucose, Bld: 121 mg/dL — ABNORMAL HIGH (ref 70–99)
Potassium: 4.1 mmol/L (ref 3.5–5.1)
Sodium: 139 mmol/L (ref 135–145)
Total Bilirubin: 0.8 mg/dL (ref 0.3–1.2)
Total Protein: 7.2 g/dL (ref 6.5–8.1)

## 2022-03-26 LAB — TSH: TSH: 0.798 u[IU]/mL (ref 0.350–4.500)

## 2022-03-26 NOTE — Progress Notes (Signed)
Letter out for lab results . ?

## 2022-04-17 ENCOUNTER — Telehealth: Payer: Self-pay

## 2022-04-17 DIAGNOSIS — F411 Generalized anxiety disorder: Secondary | ICD-10-CM

## 2022-04-17 DIAGNOSIS — F332 Major depressive disorder, recurrent severe without psychotic features: Secondary | ICD-10-CM

## 2022-04-17 DIAGNOSIS — G4700 Insomnia, unspecified: Secondary | ICD-10-CM

## 2022-04-17 MED ORDER — MIRTAZAPINE 15 MG PO TABS: 15.0000 mg | ORAL_TABLET | Freq: Every day | ORAL | 0 refills | Status: DC

## 2022-04-17 NOTE — Telephone Encounter (Signed)
received fax requesting a 90 day supply of the mirtazapine 

## 2022-04-17 NOTE — Telephone Encounter (Signed)
I have sent a 90-day supply of mirtazapine to pharmacy.

## 2022-05-06 ENCOUNTER — Telehealth (INDEPENDENT_AMBULATORY_CARE_PROVIDER_SITE_OTHER): Payer: Medicare Other | Admitting: Psychiatry

## 2022-05-06 ENCOUNTER — Encounter: Payer: Self-pay | Admitting: Psychiatry

## 2022-05-06 DIAGNOSIS — F1311 Sedative, hypnotic or anxiolytic abuse, in remission: Secondary | ICD-10-CM

## 2022-05-06 DIAGNOSIS — F3342 Major depressive disorder, recurrent, in full remission: Secondary | ICD-10-CM | POA: Diagnosis not present

## 2022-05-06 DIAGNOSIS — F332 Major depressive disorder, recurrent severe without psychotic features: Secondary | ICD-10-CM

## 2022-05-06 DIAGNOSIS — F411 Generalized anxiety disorder: Secondary | ICD-10-CM | POA: Diagnosis not present

## 2022-05-06 DIAGNOSIS — F1111 Opioid abuse, in remission: Secondary | ICD-10-CM

## 2022-05-06 DIAGNOSIS — G4701 Insomnia due to medical condition: Secondary | ICD-10-CM

## 2022-05-06 DIAGNOSIS — F1290 Cannabis use, unspecified, uncomplicated: Secondary | ICD-10-CM

## 2022-05-06 NOTE — Progress Notes (Unsigned)
Virtual Visit via Video Note  I connected with Lawrence Pham on 05/06/22 at  9:00 AM EDT by a video enabled telemedicine application and verified that I am speaking with the correct person using two identifiers.  Location Provider Location : [facility] Patient Location : Home  Participants: Patient , Provider    I discussed the limitations of evaluation and management by telemedicine and the availability of in person appointments. The patient expressed understanding and agreed to proceed.   I discussed the assessment and treatment plan with the patient. The patient was provided an opportunity to ask questions and all were answered. The patient agreed with the plan and demonstrated an understanding of the instructions.   The patient was advised to call back or seek an in-person evaluation if the symptoms worsen or if the condition fails to improve as anticipated.  BH MD OP Progress Note  05/06/2022 9:40 AM Lawrence Pham  MRN:  [id number]  Chief Complaint:  Chief Complaint  Patient presents with   Follow-up: 41 year old Caucasian male with history of MDD, anxiety, chronic pain, long-term use of cannabis, presented for medication management.   HPI: Lawrence Pham is a 41 year old Caucasian male, married, disabled, lives in [place], has a history of MDD, GAD, insomnia, unintentional weight loss, long-term use of cannabis, chronic pain was evaluated by telemedicine today.  Patient today reports he feels more motivated, his anhedonia has improved as well as he feels happy on the current dosage of mirtazapine.  He denies side effects to the mirtazapine at this time.  Patient reports he falls asleep okay with the mirtazapine and gabapentin combination.  He takes gabapentin for neuropathy.  Patient however reports he does wake up intermittently throughout the night due to pain.  He however is not interested in increasing the dosage of mirtazapine at this time.  Patient reports he is afraid of  taking any kind of medications due to his past history of opioid addiction.  Patient denies any suicidality, homicidality or perceptual disturbances.  Patient continues to use cannabis, reports he uses cannabis drops which he puts under his tongue as well as uses a cream.  He uses it for pain.  He is not worried about addictive potential of cannabis since he is able to stop using it when he wants to and denies any withdrawal symptoms.  Patient reports without this cannabis use his pain is out of control and hence he wants to use it.  Aware of drug to drug interaction with his medications.  Patient continues to struggle with appetite and does not have a good schedule with his food intake throughout the day. Patient however reports he does eat a huge meal at the end of the day most of the time.  Patient denies any other concerns today.     Visit Diagnosis:    ICD-10-CM   1. Severe episode of recurrent major depressive disorder, without psychotic features (HCC)  F33.2     2. GAD (generalized anxiety disorder)  F41.1     3. Insomnia due to medical condition  G47.01    pain    4. Long term current use of cannabis  F12.90     5. History of opioid abuse (HCC)  F11.11     6. Benzodiazepine abuse in remission [facility])  F13.11       Past Psychiatric History: Reviewed past psychiatric history from progress note on 01/11/2022.  Past trials of medications like Effexor, Valium.  Past Medical History:  Past Medical  History:  Diagnosis Date   Allergy    Asthma    Chronic pain     Past Surgical History:  Procedure Laterality Date   MANDIBLE SURGERY Left 2005    Family Psychiatric History: Reviewed family psychiatric history from progress note on 01/11/2022.  Family History:  Family History  Problem Relation Age of Onset   Drug abuse Father     Social History: Reviewed social history from progress note on 01/11/2022. Social History   Socioeconomic History   Marital status: Married     Spouse name: Not on file   Number of children: 2   Years of education: 12   Highest education level: Some college, no degree  Occupational History   Not on file  Tobacco Use   Smoking status: Never   Smokeless tobacco: Former    Types: Snuff  Vaping Use   Vaping Use: Every day   Substances: THC, Mixture of cannabinoids  Substance and Sexual Activity   Alcohol use: No   Drug use: Not Currently   Sexual activity: Not on file  Other Topics Concern   Not on file  Social History Narrative   Not on file   Social Determinants of Health   Financial Resource Strain: Not on file  Food Insecurity: Not on file  Transportation Needs: Not on file  Physical Activity: Not on file  Stress: Not on file  Social Connections: Not on file    Allergies:  Allergies  Allergen Reactions   Nucynta [Tapentadol] Nausea And Vomiting   Tramadol Rash    Metabolic Disorder Labs: No results found for: "HGBA1C", "MPG" No results found for: "PROLACTIN" No results found for: "CHOL", "TRIG", "HDL", "CHOLHDL", "VLDL", "LDLCALC" Lab Results  Component Value Date   TSH 0.798 03/26/2022    Therapeutic Level Labs: No results found for: "LITHIUM" No results found for: "VALPROATE" No results found for: "CBMZ"  Current Medications: Current Outpatient Medications  Medication Sig Dispense Refill   alfuzosin (UROXATRAL) 10 MG 24 hr tablet Take 10 mg by mouth daily.     azelastine (ASTELIN) 0.1 % nasal spray Place into both nostrils.     diclofenac (VOLTAREN) 75 MG EC tablet Take 1 tablet (75 mg total) by mouth 2 (two) times daily as needed for moderate pain (knee pain). 60 tablet 1   EPINEPHrine 0.3 mg/0.3 mL IJ SOAJ injection See admin instructions.     fluticasone (FLONASE) 50 MCG/ACT nasal spray Place into both nostrils.     gabapentin (NEURONTIN) 300 MG capsule Take 1 capsule (300 mg total) by mouth 3 (three) times daily. (Patient taking differently: Take 300 mg by mouth 3 (three) times daily. Takes  differently) 90 capsule 2   glycopyrrolate (ROBINUL) 1 MG tablet Take 2 mg by mouth daily.     mirtazapine (REMERON) 15 MG tablet Take 1 tablet (15 mg total) by mouth at bedtime. 90 tablet 0   montelukast (SINGULAIR) 10 MG tablet Take 10 mg by mouth at bedtime.     No current facility-administered medications for this visit.     Musculoskeletal: Strength & Muscle Tone:  UTA Gait & Station:  Seated Patient leans: N/A  Psychiatric Specialty Exam: Review of Systems  Musculoskeletal:  Positive for back pain.       Knee pain - BL  Psychiatric/Behavioral:  Positive for decreased concentration and sleep disturbance.   All other systems reviewed and are negative.   There were no vitals taken for this visit.There is no height or weight on  file to calculate BMI.  General Appearance: Casual  Eye Contact:  Fair  Speech:  Clear and Coherent  Volume:  Normal  Mood:  Euthymic  Affect:  Congruent  Thought Process:  Goal Directed and Descriptions of Associations: Intact  Orientation:  Full (Time, Place, and Person)  Thought Content: Logical   Suicidal Thoughts:  No  Homicidal Thoughts:  No  Memory:  Immediate;   Fair Recent;   Fair Remote;   Fair  Judgement:  Fair  Insight:  Fair  Psychomotor Activity:  Normal  Concentration:  Concentration: Fair and Attention Span: Fair  Recall:  [employer] of Knowledge: Fair  Language: Fair  Akathisia:  No  Handed:  Right  AIMS (if indicated): not done  Assets:  Communication Skills Desire for Improvement Housing Social Support  ADL's:  Intact  Cognition: WNL  Sleep:   restless although improving   Screenings: AIMS    Flowsheet Row Office Visit from 03/18/2022 in [facility]  AIMS Total Score 0      GAD-7    Flowsheet Row Video Visit from 01/11/2022 in [facility]  Total GAD-7 Score 15      PHQ2-9    Flowsheet Row Video Visit from 05/06/2022 in [facility] Office Visit from 03/18/2022 in [facility] Video Visit from 01/11/2022 in [facility]  PHQ-2 Total Score 0 3 3  PHQ-9 Total Score 7 16 18       Flowsheet Row Video Visit from 05/06/2022 in [facility] Video Visit from 01/11/2022 in [facility]  C-SSRS RISK CATEGORY No Risk No Risk        Assessment and Plan: Lawrence Pham is a 41 year old Caucasian male, married, disabled, lives in [place], has a history of depression, anxiety was evaluated by telemedicine today.  Patient with chronic pain, currently reports good response to mirtazapine with regards to his mood and sleep.  Discussed plan as noted below.  Plan MDD in remission Mirtazapine 15 mg p.o. nightly   GAD-improving Mirtazapine 15 mg p.o. nightly  Insomnia-improving Mirtazapine 15 mg p.o. nightly.  Long-term use of cannabis-provided counseling.  History of opioid abuse/benzodiazepine abuse in remission-Will continue to monitor.  Reviewed and discussed labs-CMP-dated 03/26/2022-glucose slightly elevated otherwise within normal limits, CBC with differential-within normal limits, TSH-within normal limits.  EKG-dated 03/26/2022-normal sinus rhythm.  Patient advised to establish care with a primary care provider.  Follow-up in clinic in 3 months or sooner if needed.  Collaboration of Care: Collaboration of Care: Primary Care Provider AEB encouraged to establish care with primary care provider.  Patient/Guardian was advised Release of Information must be obtained prior to any record release in order to collaborate their care with an outside provider. Patient/Guardian was advised if they have not already done so to contact the registration department to sign all necessary forms in order for 05/26/2022 to release information regarding their care.   Consent: Patient/Guardian gives verbal consent for treatment and  assignment of benefits for services provided during this visit. Patient/Guardian expressed understanding and agreed to proceed.   This note was generated in part or whole with voice recognition software. Voice recognition is usually quite accurate but there are transcription errors that can and very often do occur. I apologize for any typographical errors that were not detected and corrected.      [place], MD 05/06/2022, 9:40 AM

## 2022-05-22 ENCOUNTER — Other Ambulatory Visit: Payer: Self-pay | Admitting: Student in an Organized Health Care Education/Training Program

## 2022-05-29 ENCOUNTER — Other Ambulatory Visit: Payer: Self-pay | Admitting: Psychiatry

## 2022-05-29 DIAGNOSIS — G4700 Insomnia, unspecified: Secondary | ICD-10-CM

## 2022-05-29 DIAGNOSIS — F332 Major depressive disorder, recurrent severe without psychotic features: Secondary | ICD-10-CM

## 2022-05-29 DIAGNOSIS — F411 Generalized anxiety disorder: Secondary | ICD-10-CM

## 2022-06-26 ENCOUNTER — Telehealth: Payer: Self-pay

## 2022-06-26 NOTE — Telephone Encounter (Signed)
pt wife called states that they have concerns about his weigh and that his medication is not working that he not eatting and not sleeping.

## 2022-06-27 NOTE — Telephone Encounter (Signed)
Please contact and schedule a sooner IN PERSON visit. If in a crisis please ask to go to emergency department or urgent care. Also ,please ask to contact Primary care to get a complete work up done and possibly dietician referral . This was all discussed with patient previously.

## 2022-06-27 NOTE — Telephone Encounter (Signed)
tried to call patient back no answer no message could be left.

## 2022-07-25 ENCOUNTER — Encounter: Payer: Self-pay | Admitting: Psychiatry

## 2022-07-25 ENCOUNTER — Telehealth (INDEPENDENT_AMBULATORY_CARE_PROVIDER_SITE_OTHER): Payer: Medicare Other | Admitting: Psychiatry

## 2022-07-25 DIAGNOSIS — F411 Generalized anxiety disorder: Secondary | ICD-10-CM | POA: Diagnosis not present

## 2022-07-25 DIAGNOSIS — F1311 Sedative, hypnotic or anxiolytic abuse, in remission: Secondary | ICD-10-CM

## 2022-07-25 DIAGNOSIS — F1111 Opioid abuse, in remission: Secondary | ICD-10-CM

## 2022-07-25 DIAGNOSIS — F330 Major depressive disorder, recurrent, mild: Secondary | ICD-10-CM

## 2022-07-25 DIAGNOSIS — F1290 Cannabis use, unspecified, uncomplicated: Secondary | ICD-10-CM

## 2022-07-25 DIAGNOSIS — G4701 Insomnia due to medical condition: Secondary | ICD-10-CM

## 2022-07-25 MED ORDER — MIRTAZAPINE 30 MG PO TABS: 30.0000 mg | ORAL_TABLET | Freq: Every day | ORAL | 0 refills | Status: DC

## 2022-07-25 NOTE — Progress Notes (Signed)
Virtual Visit via Video Note  I connected with Lawrence Pham on 07/25/22 at  1:30 PM EDT by a video enabled telemedicine application and verified that I am speaking with the correct person using two identifiers. Location Provider Location : [facility] Patient Location : Home  Participants: Patient , Provider   I discussed the limitations of evaluation and management by telemedicine and the availability of in person appointments. The patient expressed understanding and agreed to proceed.   I discussed the assessment and treatment plan with the patient. The patient was provided an opportunity to ask questions and all were answered. The patient agreed with the plan and demonstrated an understanding of the instructions.   The patient was advised to call back or seek an in-person evaluation if the symptoms worsen or if the condition fails to improve as anticipated.   BH MD OP Progress Note  07/25/2022 2:01 PM Lawrence Pham  MRN:  [id number]  Chief Complaint:  Chief Complaint  Patient presents with   Follow-up: 41 year old Caucasian male with history of depression, anxiety, long-term use of cannabis, chronic pain, presented for medication management.   HPI: Lawrence Pham is a 41 year old Caucasian male, married, disabled, lives in [place], has a history of MDD, GAD, insomnia, unintentional weight loss, long-term use of cannabis, chronic pain was evaluated by telemedicine today.  Patient today reports he has multiple psychosocial stressors.  Patient reports his father as well as his grandfather are currently struggling with health problems.  His mother is unable to take care of a lot of things and hence he has been busy helping her out.  That puts a a lot of stress on himself.  He continues to struggle with significant pain.  Patient reports he continues to have low appetite, does not eat much.  Unsure if he has lost any weight.  Patient continues to struggle with sleep mostly because of his  pain.  Patient continues to use cannabis, is not planning to quit.  Reports that helps with his pain.  Reports he continues to have days when he feels sad, has low energy, decreased appetite.  Patient also reports having anxiety symptoms, being restless on edge often.  Reports he feels more motivated than before to do certain activities like his yard work.  Patient is currently compliant on the mirtazapine.  Denies side effects.  Patient denies any suicidality or homicidality or perceptual disturbances.  Has not been able to establish care with a primary care provider yet however he is motivated to do so.  Visit Diagnosis: R/O Substance induced depression and anxiety disorder ( Cannabis )   ICD-10-CM   1. MDD (major depressive disorder), recurrent episode, mild (HCC)  F33.0 mirtazapine (REMERON) 30 MG tablet    2. GAD (generalized anxiety disorder)  F41.1 mirtazapine (REMERON) 30 MG tablet    3. Insomnia due to medical condition  G47.01    Pain    4. Long term current use of cannabis  F12.90     5. History of opioid abuse (HCC)  F11.11     6. Benzodiazepine abuse in remission [facility])  F13.11       Past Psychiatric History: Reviewed past psychiatric history from progress note on 01/11/2022.  Past trials of medications like Effexor, Valium.  Past Medical History:  Past Medical History:  Diagnosis Date   Allergy    Asthma    Chronic pain     Past Surgical History:  Procedure Laterality Date   MANDIBLE SURGERY Left 2005  Family Psychiatric History: Reviewed family psychiatric history from progress note on 01/11/2022.  Family History:  Family History  Problem Relation Age of Onset   Drug abuse Father     Social History: Reviewed social history from progress note on 01/11/2022. Social History   Socioeconomic History   Marital status: Married    Spouse name: Not on file   Number of children: 2   Years of education: 12   Highest education level: Some college, no degree   Occupational History   Not on file  Tobacco Use   Smoking status: Never   Smokeless tobacco: Former    Types: Snuff  Vaping Use   Vaping Use: Every day   Substances: THC, Mixture of cannabinoids  Substance and Sexual Activity   Alcohol use: No   Drug use: Not Currently   Sexual activity: Not on file  Other Topics Concern   Not on file  Social History Narrative   Not on file   Social Determinants of Health   Financial Resource Strain: Not on file  Food Insecurity: Not on file  Transportation Needs: Not on file  Physical Activity: Not on file  Stress: Not on file  Social Connections: Not on file    Allergies:  Allergies  Allergen Reactions   Nucynta [Tapentadol] Nausea And Vomiting   Tramadol Rash    Metabolic Disorder Labs: No results found for: "HGBA1C", "MPG" No results found for: "PROLACTIN" No results found for: "CHOL", "TRIG", "HDL", "CHOLHDL", "VLDL", "LDLCALC" Lab Results  Component Value Date   TSH 0.798 03/26/2022    Therapeutic Level Labs: No results found for: "LITHIUM" No results found for: "VALPROATE" No results found for: "CBMZ"  Current Medications: Current Outpatient Medications  Medication Sig Dispense Refill   alfuzosin (UROXATRAL) 10 MG 24 hr tablet Take 10 mg by mouth daily.     diclofenac (VOLTAREN) 75 MG EC tablet Take 1 tablet (75 mg total) by mouth 2 (two) times daily as needed for moderate pain (knee pain). 60 tablet 1   gabapentin (NEURONTIN) 300 MG capsule Take 1 capsule (300 mg total) by mouth 3 (three) times daily. (Patient taking differently: Take 300 mg by mouth 3 (three) times daily. Takes differently) 90 capsule 2   glycopyrrolate (ROBINUL) 1 MG tablet Take 2 mg by mouth daily.     mirtazapine (REMERON) 30 MG tablet Take 1 tablet (30 mg total) by mouth at bedtime. 90 tablet 0   montelukast (SINGULAIR) 10 MG tablet Take 10 mg by mouth at bedtime.     azelastine (ASTELIN) 0.1 % nasal spray Place into both nostrils. (Patient not  taking: Reported on 07/25/2022)     EPINEPHrine 0.3 mg/0.3 mL IJ SOAJ injection See admin instructions. (Patient not taking: Reported on 07/25/2022)     fluticasone (FLONASE) 50 MCG/ACT nasal spray Place into both nostrils. (Patient not taking: Reported on 07/25/2022)     No current facility-administered medications for this visit.     Musculoskeletal: Strength & Muscle Tone:  UTA Gait & Station:  Seated Patient leans: N/A  Psychiatric Specialty Exam: Review of Systems  Musculoskeletal:  Positive for arthralgias, back pain, myalgias and neck pain.       Knee pain BL  Psychiatric/Behavioral:  Positive for dysphoric mood and sleep disturbance. The patient is nervous/anxious.   All other systems reviewed and are negative.   There were no vitals taken for this visit.There is no height or weight on file to calculate BMI.  General Appearance: Casual  Eye  Contact:  Fair  Speech:  Clear and Coherent  Volume:  Normal  Mood:  Anxious,reports low mood on and off  Affect:  Congruent  Thought Process:  Goal Directed and Descriptions of Associations: Intact  Orientation:  Full (Time, Place, and Person)  Thought Content: Logical   Suicidal Thoughts:  No  Homicidal Thoughts:  No  Memory:  Immediate;   Fair Recent;   Fair Remote;   Fair  Judgement:  Fair  Insight:  Shallow  Psychomotor Activity:  Normal  Concentration:  Concentration: Fair and Attention Span: Fair  Recall:  [employer] of Knowledge: Fair  Language: Fair  Akathisia:  No  Handed:  Left  AIMS (if indicated): not done  Assets:  Communication Skills Desire for Improvement Housing Social Support Transportation  ADL's:  Intact  Cognition: WNL  Sleep:   restless at time due to pain   Screenings: AIMS    Flowsheet Row Office Visit from [facility]/18/2022 in [facility]  AIMS Total Score 0      GAD-7    Flowsheet Row Video Visit from 07/25/2022 in [facility] Video Visit  from 01/11/2022 in [facility]  Total GAD-7 Score 12 15      PHQ2-9    Flowsheet Row Video Visit from 07/25/2022 in [facility] Video Visit from 05/06/2022 in [facility] Office Visit from 03/18/2022 in [facility] Video Visit from 01/11/2022 in [facility]  PHQ-2 Total Score 2 0 3 3  PHQ-9 Total Score 11 7 16 18       Flowsheet Row Video Visit from 07/25/2022 in [facility] Video Visit from 05/06/2022 in [facility] Video Visit from 01/11/2022 in [facility]  C-SSRS RISK CATEGORY No Risk No Risk No Risk        Assessment and Plan: Lawrence Pham is a 41 year old Caucasian male, married, disabled, lives in [place], has a history of depression, anxiety was evaluated by telemedicine today.  Patient continues to struggle with mood symptoms, mostly exacerbated by his chronic pain also reports chronic loss of appetite, as well as continued use of cannabis, will benefit from the following plan.  Plan MDD-unstable, R/O SUBSTANCE INDUCED DEPRESSION  Increase mirtazapine to 30 mg p.o. nightly. Discussed that there are other medications which could be tried however would recommend stopping the cannabis prior to trial of more medications.  Unknown if cannabis itself is contributing to amotivation and depression like symptoms.  Discussed drug to drug interaction.  Patient is not willing to quit. Discussed referral for CBT, patient agrees to find a therapist.  GAD-unstable R/O SUBSTANCE INDUCED ANXIETY Increase mirtazapine to 30 mg p.o. nightly Referral for CBT.  Insomnia-improving Will need sufficient pain management. Mirtazapine as prescribed  Long-term use of cannabis-provided counseling again.  History of opioid abuse/benzodiazepine abuse in remission-Will monitor  closely  Patient advised to establish care with a primary care provider given his long-term appetite reduction, weight loss.  Patient also establish care with psychotherapist.  Patient to come into the office for in person visit in 6 to 8 weeks or sooner if needed.   Collaboration of Care: Collaboration of Care: Referral or follow-up with counselor/therapist AEB patient advised to establish care with a therapist, patient advised to establish care with primary care provider.  Patient/Guardian was advised Release of Information must be obtained prior to any record release in order to collaborate their care with  an outside provider. Patient/Guardian was advised if they have not already done so to contact the registration department to sign all necessary forms in order for [place] to release information regarding their care.   Consent: Patient/Guardian gives verbal consent for treatment and assignment of benefits for services provided during this visit. Patient/Guardian expressed understanding and agreed to proceed.   This note was generated in part or whole with voice recognition software. Voice recognition is usually quite accurate but there are transcription errors that can and very often do occur. I apologize for any typographical errors that were not detected and corrected.      Jomarie Longs, MD 07/26/2022, 8:11 AM

## 2022-07-25 NOTE — Patient Instructions (Signed)
[email or web address]  [email or web address]   [facility] [phone]   [facility] [phone]  [facility] - [phone]

## 2022-08-18 ENCOUNTER — Other Ambulatory Visit: Payer: Self-pay | Admitting: Psychiatry

## 2022-08-18 DIAGNOSIS — F411 Generalized anxiety disorder: Secondary | ICD-10-CM

## 2022-08-18 DIAGNOSIS — F332 Major depressive disorder, recurrent severe without psychotic features: Secondary | ICD-10-CM

## 2022-08-18 DIAGNOSIS — G4700 Insomnia, unspecified: Secondary | ICD-10-CM

## 2022-09-10 ENCOUNTER — Other Ambulatory Visit: Payer: Self-pay | Admitting: Psychiatry

## 2022-09-10 DIAGNOSIS — F411 Generalized anxiety disorder: Secondary | ICD-10-CM

## 2022-09-10 DIAGNOSIS — G4700 Insomnia, unspecified: Secondary | ICD-10-CM

## 2022-09-10 DIAGNOSIS — F332 Major depressive disorder, recurrent severe without psychotic features: Secondary | ICD-10-CM

## 2022-09-25 ENCOUNTER — Encounter: Payer: Self-pay | Admitting: Psychiatry

## 2022-09-25 ENCOUNTER — Ambulatory Visit (INDEPENDENT_AMBULATORY_CARE_PROVIDER_SITE_OTHER): Payer: Medicare Other | Admitting: Psychiatry

## 2022-09-25 VITALS — BP 92/59 | HR 94 | Temp 98.1°F | Ht 72.0 in | Wt 125.6 lb

## 2022-09-25 DIAGNOSIS — F3341 Major depressive disorder, recurrent, in partial remission: Secondary | ICD-10-CM

## 2022-09-25 DIAGNOSIS — F411 Generalized anxiety disorder: Secondary | ICD-10-CM | POA: Diagnosis not present

## 2022-09-25 DIAGNOSIS — G4701 Insomnia due to medical condition: Secondary | ICD-10-CM

## 2022-09-25 DIAGNOSIS — F1290 Cannabis use, unspecified, uncomplicated: Secondary | ICD-10-CM

## 2022-09-25 DIAGNOSIS — F1311 Sedative, hypnotic or anxiolytic abuse, in remission: Secondary | ICD-10-CM

## 2022-09-25 DIAGNOSIS — F1111 Opioid abuse, in remission: Secondary | ICD-10-CM

## 2022-09-25 MED ORDER — MIRTAZAPINE 45 MG PO TABS: 45.0000 mg | ORAL_TABLET | Freq: Every day | ORAL | 0 refills | Status: DC

## 2022-09-25 NOTE — Progress Notes (Signed)
BH MD OP Progress Note  09/25/2022 2:32 PM Lawrence Pham  MRN:  [id number]  Chief Complaint:  Chief Complaint  Patient presents with   Follow-up   Anxiety   Medication Refill   HPI: Lawrence Pham is a 41 year old Caucasian male, married, disabled, lives in [place], has a history of MDD, GAD, insomnia, unintentional weight loss, long-term use of cannabis, chronic pain was evaluated in office today.  Patient today reports the mirtazapine is beneficial, his depression symptoms has improved.  He also did not have anxiety or panic attacks for several weeks up until this week.  Reports his father is currently struggling with terminal medical issues which is anxiety provoking for him.  His mother is having a hard time taking care of him.  Patient reports he hence has been anxious and worried about that and that likely triggered his most recent panic attacks this week.  Patient reports he had panic attacks a couple of times at night this week, lasting for 5 minutes or so, had extreme anxiety, racing heart rate, shortness of breath.  Reports he was able to make use of his coping strategies, relaxation techniques, applied something cool to his face which helped.  Patient continues to be in significant pain.  Reports he has always struggled with it.  Reports he treats his pain by smoking cannabis.  He is not agreeable to quitting it since he believes it is helpful.  Patient reports appetite may have improved since being on the mirtazapine.  He may have gained a few pounds in the past several months.  That is an improvement.  Denies any suicidality, homicidality or perceptual disturbances.  Denies any other concerns today.  Visit Diagnosis: Rule out substance induced depression and anxiety disorders-due to cannabis.   ICD-10-CM   1. MDD (major depressive disorder), recurrent, in partial remission (HCC)  F33.41 mirtazapine (REMERON) 45 MG tablet    2. GAD (generalized anxiety disorder)  F41.1  mirtazapine (REMERON) 45 MG tablet    3. Insomnia due to medical condition  G47.01 mirtazapine (REMERON) 45 MG tablet   Pain    4. Long term current use of cannabis  F12.90     5. History of opioid abuse (HCC)  F11.11     6. Benzodiazepine abuse in remission [facility])  F13.11       Past Psychiatric History: Reviewed past psychiatric history from progress note on 01/11/2022.  Past trials of medications like Effexor, Valium.  Past Medical History:  Past Medical History:  Diagnosis Date   Allergy    Asthma    Chronic pain     Past Surgical History:  Procedure Laterality Date   MANDIBLE SURGERY Left 2005    Family Psychiatric History: Reviewed family psychiatric history from progress note on 01/11/2022.  Family History:  Family History  Problem Relation Age of Onset   Drug abuse Father     Social History: Reviewed social history from progress note on 01/11/2022. Social History   Socioeconomic History   Marital status: Married    Spouse name: Not on file   Number of children: 2   Years of education: 12   Highest education level: Some college, no degree  Occupational History   Not on file  Tobacco Use   Smoking status: Never   Smokeless tobacco: Former    Types: Snuff  Vaping Use   Vaping Use: Every day   Substances: THC, Mixture of cannabinoids  Substance and Sexual Activity   Alcohol use: No  Drug use: Not Currently   Sexual activity: Not on file  Other Topics Concern   Not on file  Social History Narrative   Not on file   Social Determinants of Health   Financial Resource Strain: Not on file  Food Insecurity: Not on file  Transportation Needs: Not on file  Physical Activity: Not on file  Stress: Not on file  Social Connections: Not on file    Allergies:  Allergies  Allergen Reactions   Nucynta [Tapentadol] Nausea And Vomiting   Tramadol Rash    Metabolic Disorder Labs: No results found for: "HGBA1C", "MPG" No results found for: "PROLACTIN" No  results found for: "CHOL", "TRIG", "HDL", "CHOLHDL", "VLDL", "LDLCALC" Lab Results  Component Value Date   TSH 0.798 03/26/2022    Therapeutic Level Labs: No results found for: "LITHIUM" No results found for: "VALPROATE" No results found for: "CBMZ"  Current Medications: Current Outpatient Medications  Medication Sig Dispense Refill   alfuzosin (UROXATRAL) 10 MG 24 hr tablet Take 10 mg by mouth daily.     azelastine (ASTELIN) 0.1 % nasal spray Place into both nostrils.     diclofenac (VOLTAREN) 75 MG EC tablet Take 1 tablet (75 mg total) by mouth 2 (two) times daily as needed for moderate pain (knee pain). 60 tablet 1   EPINEPHrine 0.3 mg/0.3 mL IJ SOAJ injection See admin instructions.     fluticasone (FLONASE) 50 MCG/ACT nasal spray Place into both nostrils.     gabapentin (NEURONTIN) 300 MG capsule Take 1 capsule (300 mg total) by mouth 3 (three) times daily. (Patient taking differently: Take 300 mg by mouth 3 (three) times daily. Takes differently) 90 capsule 2   glycopyrrolate (ROBINUL) 1 MG tablet Take 2 mg by mouth daily.     mirtazapine (REMERON) 45 MG tablet Take 1 tablet (45 mg total) by mouth at bedtime. 90 tablet 0   montelukast (SINGULAIR) 10 MG tablet Take 10 mg by mouth at bedtime.     No current facility-administered medications for this visit.     Musculoskeletal: Strength & Muscle Tone: within normal limits Gait & Station:  slow , walks with cane Patient leans: N/A  Psychiatric Specialty Exam: Review of Systems  Musculoskeletal:  Positive for arthralgias, back pain and myalgias.       Chronic pain - all over his body   Psychiatric/Behavioral:  The patient is nervous/anxious.   All other systems reviewed and are negative.   Blood pressure (!) 92/59, pulse 94, temperature 98.1 F (36.7 C), temperature source Oral, height 6' (1.829 m), weight 125 lb 9.6 oz (57 kg).Body mass index is 17.03 kg/m.  General Appearance: Casual  Eye Contact:  Fair  Speech:  Clear  and Coherent  Volume:  Normal  Mood:  Anxious  Affect:  Congruent  Thought Process:  Goal Directed and Descriptions of Associations: Intact  Orientation:  Full (Time, Place, and Person)  Thought Content: Logical   Suicidal Thoughts:  No  Homicidal Thoughts:  No  Memory:  Immediate;   Fair Recent;   Fair Remote;   Fair  Judgement:  Fair  Insight:  Shallow  Psychomotor Activity:  Normal  Concentration:  Concentration: Fair and Attention Span: Fair  Recall:  [employer] of Knowledge: Fair  Language: Fair  Akathisia:  No  Handed:  Left  AIMS (if indicated): not done  Assets:  Communication Skills Desire for Improvement Social Support Transportation  ADL's:  Intact  Cognition: WNL  Sleep:  Fair  Screenings: AIMS    Flowsheet Row Office Visit from 03/18/2022 in [facility]  AIMS Total Score 0      GAD-7    Flowsheet Row Office Visit from 09/25/2022 in [facility] Video Visit from 07/25/2022 in [facility] Video Visit from 01/11/2022 in [facility]  Total GAD-7 Score 5 12 15       PHQ2-9    Flowsheet Row Office Visit from 09/25/2022 in [facility] Video Visit from 07/25/2022 in [facility] Video Visit from 05/06/2022 in [facility] Office Visit from 03/18/2022 in [facility] Video Visit from 01/11/2022 in [facility]  PHQ-2 Total Score 2 2 0 3 3  PHQ-9 Total Score 10 11 7 16 18       Flowsheet Row Office Visit from 09/25/2022 in [facility] Video Visit from 07/25/2022 in [facility] Video Visit from 05/06/2022 in [facility]  C-SSRS RISK CATEGORY No Risk No Risk No Risk        Assessment and Plan: Lawrence Pham is a 41 year old Caucasian  male, married, disabled, lives in [place] has a history of depression, anxiety was evaluated in office today.  Patient is currently improving with regards to his appetite and depression although continues to have anxiety, mostly related to psychosocial stressors as noted above, continued ongoing use of cannabis as well as continued struggles with his pain, will benefit from following plan.  Plan MDD in partial remission Increase mirtazapine to 45 mg p.o. nightly. Referred for CBT-patient has been noncompliant.  Communicated with staff here to schedule this patient with a therapist.  GAD-unstable Increase mirtazapine to 45 mg p.o. nightly Referral for CBT  Insomnia-improving Will need sufficient pain management Mirtazapine as prescribed  Long-term use of cannabis-rule out cannabis use disorder-provided counseling again.  Patient is not willing to quit.  History of opioid abuse/benzodiazepine abuse in remission-Will monitor closely  Follow-up in clinic in 2 months or sooner if needed.   This note was generated in part or whole with voice recognition software. Voice recognition is usually quite accurate but there are transcription errors that can and very often do occur. I apologize for any typographical errors that were not detected and corrected.    46, MD 09/25/2022, 2:32 PM

## 2022-09-25 NOTE — Patient Instructions (Signed)
Cannabis Use Disorder Cannabis use disorder occurs when marijuana use disrupts a person's daily life or causes health problems. This condition can be dangerous. The health problems this condition can cause include: Long-lasting problems with thinking and learning. These can be permanent in young people. Mental health problems, such as anxiety disorders, paranoia, psychosis, or schizophrenia. Dangerously high blood pressure and heart rate. Breathing problems and illness, such as bronchitis, emphysema, or lung cancer. Problems with fetal development during pregnancy and child development after pregnancy. People with this condition are also more likely to use other drugs. What are the causes? This condition is caused by using marijuana too much over time. It is not caused by using it only once in a while. Many people with this condition use marijuana because it gives them a feeling of extreme pleasure or relaxation. What increases the risk? The following factors may make a person more likely to develop this condition: Being male. Having a family history of cannabis use disorder. Having mental health issues such as depression or post-traumatic stress disorder (PTSD). What are the signs or symptoms? Symptoms of this condition include: Addiction Using marijuana in greater amounts or for longer periods of time than you want to. Craving marijuana. Spending a lot of time getting marijuana, using it, or recovering from its effects. Having problems at work, at school, at home, or in relationships because of marijuana use. Giving up or cutting down on important life activities because of marijuana use. Using marijuana at times when it is dangerous, such as while you are driving a car. Needing more and more marijuana to get the same desired effect (building up a tolerance). Lack of motivation, known as amotivational syndrome, which leads to poor school and work performance. Physical problems A  long-lasting cough. Long-term lung problems and difficulty breathing. Mental problems Hallucinations. Severe anxiety. Trouble sleeping. Increase in violent behavior in young people. Withdrawal problems You may have symptoms when you stop using marijuana. Symptoms include: Irritability or anger. Anxiety or restlessness. Trouble sleeping. Loss of appetite or weight loss. Aches and pains. Shakiness, sweating, or chills. How is this diagnosed? This condition is diagnosed with an assessment. Your health care provider will ask about your marijuana use and how it affects your life. You will be diagnosed with the condition if you have had at least two symptoms of this condition within a 69-month period. How severe the condition is depends on how many symptoms you have. If you have two to three symptoms, your condition is mild. If you have four to five symptoms, your condition is moderate. If you have six or more symptoms, your condition is severe. A physical exam or lab tests may be done to see if you have physical problems resulting from marijuana use. Your health care provider may also screen for drug use and refer you to a mental health professional for evaluation. How is this treated? Treatment for this condition is usually provided by mental health professionals with training in substance use disorders. Your treatment may involve: Counseling. This treatment is also called talk therapy. It is provided by substance use treatment counselors. A counselor can address the reasons you use marijuana and suggest ways to keep you from using it again. The goals of talk therapy are to: Find healthy activities to replace using marijuana. Identify and avoid the things that trigger your marijuana use. Help you learn how to handle cravings. Support groups. Support groups are led by people who have quit using marijuana. They provide emotional support,  advice, and guidance. Medicine. Medicine is used to treat  mental health issues that trigger marijuana use or that result from it. Follow these instructions at home: Lifestyle Make healthy lifestyle choices, such as: Eating a healthy diet. Getting enough exercise. Learning skills for managing stress.  General instructions Take over-the-counter and prescription medicines, as well as any herbal remedies, only as told by your health care provider. Check with your health care provider before starting any new medicines. Work with your counselor or group to develop tools to keep you from using marijuana again (relapsing). Learn daily living skills and work skills. Where to find more information Centers for Disease Control and Prevention (CDC): cdc.gov Substance Abuse and Mental Health Services Administration: samhsa.gov Contact a health care provider if: You are not able to take your medicines as told. Your symptoms get worse. Get help right away if: You have serious thoughts about hurting yourself or others. Get help right away if you feel like you may hurt yourself or others, or have thoughts about taking your own life. Go to your nearest emergency room or: Call 911. Call the National Suicide Prevention Lifeline at 1-800-273-8255 or 988. This is open 24 hours a day. Text the Crisis Text Line at 741741. This information is not intended to replace advice given to you by your health care provider. Make sure you discuss any questions you have with your health care provider. Document Revised: 04/10/2022 Document Reviewed: 04/10/2022 Elsevier Patient Education  2023 [employer] Inc[employer]  

## 2022-10-21 ENCOUNTER — Ambulatory Visit: Payer: Medicare Other | Admitting: Licensed Clinical Social Worker

## 2022-11-19 ENCOUNTER — Ambulatory Visit: Payer: Medicare Other | Admitting: Licensed Clinical Social Worker

## 2022-11-28 ENCOUNTER — Telehealth (INDEPENDENT_AMBULATORY_CARE_PROVIDER_SITE_OTHER): Payer: Medicare Other | Admitting: Psychiatry

## 2022-11-28 ENCOUNTER — Encounter: Payer: Self-pay | Admitting: Psychiatry

## 2022-11-28 DIAGNOSIS — G4701 Insomnia due to medical condition: Secondary | ICD-10-CM

## 2022-11-28 DIAGNOSIS — F411 Generalized anxiety disorder: Secondary | ICD-10-CM | POA: Diagnosis not present

## 2022-11-28 DIAGNOSIS — F1290 Cannabis use, unspecified, uncomplicated: Secondary | ICD-10-CM

## 2022-11-28 DIAGNOSIS — F1311 Sedative, hypnotic or anxiolytic abuse, in remission: Secondary | ICD-10-CM

## 2022-11-28 DIAGNOSIS — F3342 Major depressive disorder, recurrent, in full remission: Secondary | ICD-10-CM | POA: Diagnosis not present

## 2022-11-28 DIAGNOSIS — F1111 Opioid abuse, in remission: Secondary | ICD-10-CM

## 2022-11-28 DIAGNOSIS — F3341 Major depressive disorder, recurrent, in partial remission: Secondary | ICD-10-CM

## 2022-11-28 NOTE — Progress Notes (Signed)
Virtual Visit via Video Note  I connected with Lawrence Pham on 11/28/22 at 11:30 AM EST by a video enabled telemedicine application and verified that I am speaking with the correct person using two identifiers.  Location Provider Location : [facility] Patient Location : Home  Participants: Patient , Provider    I discussed the limitations of evaluation and management by telemedicine and the availability of in person appointments. The patient expressed understanding and agreed to proceed.   I discussed the assessment and treatment plan with the patient. The patient was provided an opportunity to ask questions and all were answered. The patient agreed with the plan and demonstrated an understanding of the instructions.   The patient was advised to call back or seek an in-person evaluation if the symptoms worsen or if the condition fails to improve as anticipated.   [facility] MD OP Progress Note  11/29/2022 8:27 AM Lawrence Pham  MRN:  [id number]  Chief Complaint:  Chief Complaint  Patient presents with   Follow-up   Anxiety   Depression   Medication Refill   HPI: Lawrence Pham is a 42 year old Caucasian male, married, disabled, lives in [place], has a history of MDD, GAD, insomnia, unintentional weight loss, long-term use of cannabis, chronic pain was evaluated by telemedicine today.  Patient today reports he continues to have anxiety regarding situational stressors, his dad who is currently dealing with terminal medical problems, supporting his mother and so on.  Patient however reports since being on the higher dosage of mirtazapine anxiety symptoms are manageable.  He looks forward to starting his therapy sessions, does have upcoming appointment with therapist.  He had to reschedule his previous visit due to personal reasons.  Patient reports sleep is overall good.  He does have panic attacks at night on and off, in the last week he had it twice. It is usually triggered by nasal  congestion/stuffiness due to seasonal allergies.  He reports he is able to cope with it better than before.  Patient denies any depression symptoms.  Denies any suicidality, homicidality or perceptual disturbances.  Patient denies side effects to mirtazapine.  Denies any other concerns today.    Visit Diagnosis: Rule out substance induced depression and anxiety due to cannabis use.   ICD-10-CM   1. MDD (major depressive disorder), recurrent, in full remission (HCC)  F33.42     2. GAD (generalized anxiety disorder)  F41.1     3. Insomnia due to medical condition  G47.01    pain    4. Long term current use of cannabis  F12.90     5. History of opioid abuse (HCC)  F11.11     6. 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  Past trials of medications like Effexor, Valium.  Past Medical History:  Past Medical History:  Diagnosis Date   Allergy    Asthma    Chronic pain     Past Surgical History:  Procedure Laterality Date   MANDIBLE SURGERY Left 2005    Family Psychiatric History: Reviewed family psychiatric history from progress note on 01/11/2022.  Family History:  Family History  Problem Relation Age of Onset   Drug abuse Father     Social History: Reviewed social history from progress note on 01/11/2022. Social History   Socioeconomic History   Marital status: Married    Spouse name: Not on file  Number of children: 2   Years of education: 12   Highest education level: Some college, no degree  Occupational History   Not on file  Tobacco Use   Smoking status: Never   Smokeless tobacco: Former    Types: Snuff  Vaping Use   Vaping Use: Every day   Substances: THC, Mixture of cannabinoids  Substance and Sexual Activity   Alcohol use: No   Drug use: Not Currently   Sexual activity: Not on file  Other Topics Concern   Not on file  Social History  Narrative   Not on file   Social Determinants of Health   Financial Resource Strain: Not on file  Food Insecurity: Not on file  Transportation Needs: Not on file  Physical Activity: Not on file  Stress: Not on file  Social Connections: Not on file    Allergies:  Allergies  Allergen Reactions   Nucynta [Tapentadol] Nausea And Vomiting   Tramadol Rash    Metabolic Disorder Labs: No results found for: "HGBA1C", "MPG" No results found for: "PROLACTIN" No results found for: "CHOL", "TRIG", "HDL", "CHOLHDL", "VLDL", "LDLCALC" Lab Results  Component Value Date   TSH 0.798 03/26/2022    Therapeutic Level Labs: No results found for: "LITHIUM" No results found for: "VALPROATE" No results found for: "CBMZ"  Current Medications: Current Outpatient Medications  Medication Sig Dispense Refill   alfuzosin (UROXATRAL) 10 MG 24 hr tablet Take 10 mg by mouth daily.     azelastine (ASTELIN) 0.1 % nasal spray Place into both nostrils.     diclofenac (VOLTAREN) 75 MG EC tablet Take 1 tablet (75 mg total) by mouth 2 (two) times daily as needed for moderate pain (knee pain). 60 tablet 1   EPINEPHrine 0.3 mg/0.3 mL IJ SOAJ injection See admin instructions.     fluticasone (FLONASE) 50 MCG/ACT nasal spray Place into both nostrils.     gabapentin (NEURONTIN) 300 MG capsule Take 1 capsule (300 mg total) by mouth 3 (three) times daily. (Patient taking differently: Take 300 mg by mouth 3 (three) times daily. Takes differently) 90 capsule 2   glycopyrrolate (ROBINUL) 1 MG tablet Take 2 mg by mouth daily.     mirtazapine (REMERON) 45 MG tablet Take 1 tablet (45 mg total) by mouth at bedtime. 90 tablet 0   montelukast (SINGULAIR) 10 MG tablet Take 10 mg by mouth at bedtime.     No current facility-administered medications for this visit.     Musculoskeletal: Strength & Muscle Tone:  UTA Gait & Station: normal Patient leans: N/A  Psychiatric Specialty Exam: Review of Systems   Musculoskeletal:  Positive for arthralgias and back pain.       Chronic pain - generalized  Psychiatric/Behavioral:  Positive for sleep disturbance. The patient is nervous/anxious.     There were no vitals taken for this visit.There is no height or weight on file to calculate BMI.  General Appearance: Casual  Eye Contact:  Good  Speech:  Clear and Coherent  Volume:  Normal  Mood:  Anxious  Affect:  Appropriate  Thought Process:  Goal Directed and Descriptions of Associations: Intact  Orientation:  Full (Time, Place, and Person)  Thought Content: Logical   Suicidal Thoughts:  No  Homicidal Thoughts:  No  Memory:  Immediate;   Fair Recent;   Fair Remote;   Fair  Judgement:  Fair  Insight:  Good  Psychomotor Activity:  Normal  Concentration:  Concentration: Fair and Attention Span: Fair  Recall:  [place] of Knowledge: Fair  Language: Fair  Akathisia:  No  Handed:  Left  AIMS (if indicated): not done  Assets:  Communication Skills Desire for [place] Talents/Skills Transportation  ADL's:  Intact  Cognition: WNL  Sleep:   Improving   Screenings: [place] Office Visit from 03/18/2022 in [place] Total Score 0      GAD-7    Flowsheet Row Video Visit from 11/28/2022 in [place] Office Visit from 09/25/2022 in [place] Video Visit from 07/25/2022 in [place] Video Visit from 01/11/2022 in [place]  Total GAD-7 Score 10 5 12 15       PHQ2-9    Flowsheet Row Video Visit from 11/28/2022 in [place] Office Visit from 09/25/2022 in [place] Video Visit from 07/25/2022 in [place] Video Visit from 05/06/2022 in [place] Office Visit from 03/18/2022  in [place]  PHQ-2 Total Score 0 2 2 0 3  PHQ-9 Total Score -- 10 11 7 16       Flowsheet Row Video Visit from 11/28/2022 in [place] Office Visit from 09/25/2022 in [place] Video Visit from 07/25/2022 in [place] No Risk No Risk No Risk        Assessment and Plan: JULLIAN CLAYSON is a 42 year old Caucasian male, married, disabled, lives in [place], has a history of depression, anxiety was evaluated by telemedicine today.  Patient with improvement with regards to his mood symptoms on the current medication regimen although does have continued situational stressors, will benefit from CBT.  Plan as noted below.  Plan MDD in remission Mirtazapine 45 mg p.o. nightly  GAD-improving Mirtazapine 45 mg p.o. nightly Referred for CBT-has upcoming appointment third week of January.  Insomnia-improving Will need sufficient pain management. Mirtazapine as prescribed  Long-term use of cannabis-rule out cannabis use disorder-provided counseling.  Patient not willing to quit.  History of opioid abuse/benzodiazepine abuse in remission-Will monitor closely  Follow-up in clinic in 3 months or sooner if needed.  Collaboration of Care: Collaboration of Care: Referral or follow-up with counselor/therapist AEB encouraged to keep his upcoming appointment with therapist.  Patient/Guardian was advised Release of Information must be obtained prior to any record release in order to collaborate their care with an outside provider. Patient/Guardian was advised if they have not already done so to contact the registration department to sign all necessary forms in order for [place] to release information regarding their care.   Consent: Patient/Guardian gives verbal consent for treatment and assignment of benefits for services provided during this visit. Patient/Guardian  expressed understanding and agreed to proceed.   This note was generated in part or whole with voice recognition software. Voice recognition is usually quite accurate but there are transcription errors that can and very often do occur. I apologize for any typographical errors that were not detected and corrected.      Ursula Alert, MD 11/29/2022, 8:27 AM

## 2022-12-09 ENCOUNTER — Ambulatory Visit (INDEPENDENT_AMBULATORY_CARE_PROVIDER_SITE_OTHER): Payer: Medicare Other | Admitting: Licensed Clinical Social Worker

## 2022-12-09 DIAGNOSIS — F411 Generalized anxiety disorder: Secondary | ICD-10-CM | POA: Diagnosis not present

## 2022-12-09 DIAGNOSIS — F330 Major depressive disorder, recurrent, mild: Secondary | ICD-10-CM
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
## 2022-12-25 ENCOUNTER — Other Ambulatory Visit: Payer: Self-pay | Admitting: Psychiatry

## 2022-12-25 DIAGNOSIS — G4701 Insomnia due to medical condition: Secondary | ICD-10-CM

## 2022-12-25 DIAGNOSIS — F411 Generalized anxiety disorder: Secondary | ICD-10-CM

## 2022-12-25 DIAGNOSIS — F3341 Major depressive disorder, recurrent, in partial remission: Secondary | ICD-10-CM

## 2023-02-11 ENCOUNTER — Other Ambulatory Visit: Payer: Self-pay | Admitting: Student in an Organized Health Care Education/Training Program

## 2023-02-14 ENCOUNTER — Telehealth: Payer: Self-pay | Admitting: *Deleted

## 2023-02-14 ENCOUNTER — Other Ambulatory Visit: Payer: Self-pay | Admitting: *Deleted

## 2023-02-14 MED ORDER — GABAPENTIN 300 MG PO CAPS: 300.0000 mg | ORAL_CAPSULE | Freq: Three times a day (TID) | ORAL | 2 refills | Status: DC

## 2023-02-14 NOTE — Telephone Encounter (Signed)
Gabapentin 300 mg qty 90 1 tid sent in and patient is aware.  They will call next week to get on the schedule for MM since he has not been seen since 11/28/21.

## 2023-02-27 ENCOUNTER — Encounter: Payer: Self-pay | Admitting: Psychiatry

## 2023-02-27 ENCOUNTER — Telehealth (INDEPENDENT_AMBULATORY_CARE_PROVIDER_SITE_OTHER): Payer: Medicare Other | Admitting: Psychiatry

## 2023-02-27 DIAGNOSIS — F3342 Major depressive disorder, recurrent, in full remission: Secondary | ICD-10-CM | POA: Diagnosis not present

## 2023-02-27 DIAGNOSIS — F1290 Cannabis use, unspecified, uncomplicated: Secondary | ICD-10-CM | POA: Diagnosis not present

## 2023-02-27 DIAGNOSIS — F411 Generalized anxiety disorder: Secondary | ICD-10-CM | POA: Diagnosis not present

## 2023-02-27 DIAGNOSIS — G4701 Insomnia due to medical condition: Secondary | ICD-10-CM

## 2023-02-27 DIAGNOSIS — F1311 Sedative, hypnotic or anxiolytic abuse, in remission: Secondary | ICD-10-CM

## 2023-02-27 DIAGNOSIS — F1111 Opioid abuse, in remission: Secondary | ICD-10-CM

## 2023-02-27 NOTE — Progress Notes (Signed)
Virtual Visit via Video Note  I connected with Lawrence Pham on 02/27/23 at 11:30 AM EDT by a video enabled telemedicine application and verified that I am speaking with the correct person using two identifiers.  Location Provider Location : [facility] Patient Location : Home  Participants: Patient , Provider    I discussed the limitations of evaluation and management by telemedicine and the availability of in person appointments. The patient expressed understanding and agreed to proceed.    I discussed the assessment and treatment plan with the patient. The patient was provided an opportunity to ask questions and all were answered. The patient agreed with the plan and demonstrated an understanding of the instructions.   The patient was advised to call back or seek an in-person evaluation if the symptoms worsen or if the condition fails to improve as anticipated.   BH MD OP Progress Note  02/28/2023 9:24 AM Lawrence Pham  MRN:  [id number]  Chief Complaint:  Chief Complaint  Patient presents with   Follow-up   Depression   Anxiety   Medication Refill   HPI: Lawrence Pham is a 42 year old Caucasian male, married, disabled, lives in [place], has a history of MDD, GAD, insomnia, history of unintentional weight loss, long-term use of cannabis, chronic pain was evaluated by telemedicine today.  Patient today reports he is currently doing well on the mirtazapine.  Overall has noticed improvement in anxiety symptoms.  Does have anxiety attacks on and off although he does have coping strategies and that does work.  When he has these attacks it last for 4 to 5 minutes and goes away.  Patient reports sleep is affected at times by pain.  However the past few weeks he has been sleeping better than before.  Patient also reports appetite as improved.  He has gained a few pounds.  Patient denies any suicidality, homicidality or perceptual disturbances.  Patient denies any other concerns  today.    Visit Diagnosis:    ICD-10-CM   1. MDD (major depressive disorder), recurrent, in full remission  F33.42     2. GAD (generalized anxiety disorder)  F41.1     3. Insomnia due to medical condition  G47.01    Pain, anxiety    4. Long term current use of cannabis  F12.90     5. History of opioid abuse  F11.11     6. Benzodiazepine abuse in remission  F13.11       Past Psychiatric History: I have reviewed past psychiatric history from progress note on 01/11/2022.  Past trials of medications like Effexor, Valium  Past Medical History:  Past Medical History:  Diagnosis Date   Allergy    Asthma    Chronic pain     Past Surgical History:  Procedure Laterality Date   MANDIBLE SURGERY Left 2005    Family Psychiatric History: I have reviewed family psychiatric history from progress note on 01/11/2022  Family History:  Family History  Problem Relation Age of Onset   Drug abuse Father     Social History: Reviewed social history from progress total on 01/11/2022 Social History   Socioeconomic History   Marital status: Married    Spouse name: Not on file   Number of children: 2   Years of education: 12   Highest education level: Some college, no degree  Occupational History   Not on file  Tobacco Use   Smoking status: Never   Smokeless tobacco: Former    Types: Snuff  Vaping Use   Vaping Use: Every day   Substances: THC, Mixture of cannabinoids  Substance and Sexual Activity   Alcohol use: No   Drug use: Not Currently   Sexual activity: Not on file  Other Topics Concern   Not on file  Social History Narrative   Not on file   Social Determinants of Health   Financial Resource Strain: Not on file  Food Insecurity: Not on file  Transportation Needs: Not on file  Physical Activity: Not on file  Stress: Not on file  Social Connections: Not on file    Allergies:  Allergies  Allergen Reactions   Nucynta [Tapentadol] Nausea And Vomiting   Tramadol  Rash    Metabolic Disorder Labs: No results found for: "HGBA1C", "MPG" No results found for: "PROLACTIN" No results found for: "CHOL", "TRIG", "HDL", "CHOLHDL", "VLDL", "LDLCALC" Lab Results  Component Value Date   TSH 0.798 03/26/2022    Therapeutic Level Labs: No results found for: "LITHIUM" No results found for: "VALPROATE" No results found for: "CBMZ"  Current Medications: Current Outpatient Medications  Medication Sig Dispense Refill   alfuzosin (UROXATRAL) 10 MG 24 hr tablet Take 10 mg by mouth daily.     azelastine (ASTELIN) 0.1 % nasal spray Place into both nostrils.     diclofenac (VOLTAREN) 75 MG EC tablet Take 1 tablet (75 mg total) by mouth 2 (two) times daily as needed for moderate pain (knee pain). 60 tablet 1   EPINEPHrine 0.3 mg/0.3 mL IJ SOAJ injection See admin instructions.     fluticasone (FLONASE) 50 MCG/ACT nasal spray Place into both nostrils.     gabapentin (NEURONTIN) 300 MG capsule Take 1 capsule (300 mg total) by mouth 3 (three) times daily. 90 capsule 2   glycopyrrolate (ROBINUL) 1 MG tablet Take 2 mg by mouth daily.     mirtazapine (REMERON) 45 MG tablet TAKE 1 TABLET BY MOUTH AT BEDTIME. 90 tablet 0   montelukast (SINGULAIR) 10 MG tablet Take 10 mg by mouth at bedtime.     No current facility-administered medications for this visit.     Musculoskeletal: Strength & Muscle Tone:  UTA Gait & Station: normal Patient leans: N/A  Psychiatric Specialty Exam: Review of Systems  Musculoskeletal:  Positive for back pain (Chronic).  Psychiatric/Behavioral: Negative.    All other systems reviewed and are negative.   There were no vitals taken for this visit.There is no height or weight on file to calculate BMI.  General Appearance: Casual  Eye Contact:  Fair  Speech:  Clear and Coherent  Volume:  Normal  Mood:  Euthymic  Affect:  Congruent  Thought Process:  Goal Directed and Descriptions of Associations: Intact  Orientation:  Full (Time, Place,  and Person)  Thought Content: Logical   Suicidal Thoughts:  No  Homicidal Thoughts:  No  Memory:  Immediate;   Fair Recent;   Fair Remote;   Fair  Judgement:  Fair  Insight:  Fair  Psychomotor Activity:  Normal  Concentration:  Concentration: Fair and Attention Span: Fair  Recall:  [employer] of Knowledge: Fair  Language: Fair  Akathisia:  No  Handed:  Right  AIMS (if indicated): not done  Assets:  Communication Skills Desire for Improvement Housing Social Support Transportation  ADL's:  Intact  Cognition: WNL  Sleep:  Fair   Screenings: AIMS    Flowsheet Row Office Visit from [facility]/18/2022 in [facility]  AIMS Total Score 0  GAD-7    Flowsheet Row Counselor [facility] Video [facility] Office [facility] Video [facility] Video Visit from [facility]20[facility]  Total GAD-7 Score 14 10 5 12 15       PHQ2-9    Flowsheet Row Counselor from 12/09/2022 in [facility] Video Visit [facility]20[facility] Office Visit from 09/[facility]/20[facility] Video Visit from 07/25/2022 [facility] Video Visit from [facility]/20[facility]  PHQ-2 Total Score 3 0 2 2 0  PHQ-9 Total Score 12 -- 10 11 7       Flowsheet Row Video [facility]20[facility] Counselor from [facility]20[facility] Video [id number]  C-SSRS RISK CATEGORY No Risk No Risk No Risk        Assessment and Plan: Lawrence Pham is a 42 year old Caucasian male, married, disabled, lives in [place], has a history of depression, anxiety was evaluated by telemedicine today.  Patient is currently improving on the current medication regimen plan as noted below.  Plan MDD in remission Mirtazapine 45 mg p.o. nightly  GAD-improving Mirtazapine 45 mg p.o. nightly Referred for CBT in the past.  Insomnia-improving Continue mirtazapine 45 mg p.o. nightly Patient will need sufficient pain management.  Long-term use of cannabis-we will monitor closely.  History of opioid abuse/benzodiazepine abuse in remission-currently stable.  Follow-up in clinic in 4 months or sooner if needed.  I have spent atleast 30 minutes face to face with patient today which includes the time spent for preparing to see the patient ( e.g., review of test, records ), obtaining and to review and separately obtained history , ordering medications,psychoeducation and supportive psychotherapy and care coordination,as well as documenting clinical information in electronic health record.   Consent: Patient/Guardian gives verbal consent for treatment and assignment of benefits for services provided during this visit. Patient/Guardian expressed understanding and agreed to proceed.   This note was generated in part or whole with voice recognition software. Voice recognition is usually quite accurate but there are transcription errors that can and very often do occur. I apologize for any typographical errors that were not detected and corrected.    Jomarie LongsSaramma Ranulfo Kall, MD 02/28/2023, 9:24 AM

## 2023-03-25 ENCOUNTER — Ambulatory Visit
Payer: Medicare Other | Attending: Student in an Organized Health Care Education/Training Program | Admitting: Student in an Organized Health Care Education/Training Program

## 2023-03-25 ENCOUNTER — Encounter: Payer: Self-pay | Admitting: Student in an Organized Health Care Education/Training Program

## 2023-03-25 DIAGNOSIS — M170 Bilateral primary osteoarthritis of knee: Secondary | ICD-10-CM | POA: Diagnosis not present

## 2023-03-25 DIAGNOSIS — G894 Chronic pain syndrome: Secondary | ICD-10-CM

## 2023-03-25 DIAGNOSIS — M25552 Pain in left hip: Secondary | ICD-10-CM

## 2023-03-25 DIAGNOSIS — M7918 Myalgia, other site: Secondary | ICD-10-CM | POA: Diagnosis not present

## 2023-03-25 DIAGNOSIS — F321 Major depressive disorder, single episode, moderate: Secondary | ICD-10-CM | POA: Diagnosis not present

## 2023-03-25 DIAGNOSIS — M25551 Pain in right hip: Secondary | ICD-10-CM | POA: Diagnosis not present

## 2023-03-25 MED ORDER — GABAPENTIN 300 MG PO CAPS: 300.0000 mg | ORAL_CAPSULE | Freq: Three times a day (TID) | ORAL | 5 refills | Status: DC

## 2023-03-25 MED ORDER — DICLOFENAC SODIUM 75 MG PO TBEC: 75.0000 mg | DELAYED_RELEASE_TABLET | Freq: Two times a day (BID) | ORAL | 5 refills | Status: DC | PRN

## 2023-03-25 NOTE — Progress Notes (Signed)
Patient: Lawrence Pham  Service Category: E/M  Provider: Edward Jolly, MD  DOB: 06/21/1981  DOS: 03/25/2023  Location: Office  MRN: [id number]  Setting: Ambulatory outpatient  Referring Provider: No ref. provider found  Type: Established Patient  Specialty: Interventional Pain Management  PCP: Pcp, No  Location: Remote location  Delivery: TeleHealth     Virtual Encounter - Pain Management PROVIDER NOTE: Information contained herein reflects review and annotations entered in association with encounter. Interpretation of such information and data should be left to medically-trained personnel. Information provided to patient can be located elsewhere in the medical record under "Patient Instructions". Document created using STT-dictation technology, any transcriptional errors that may result from process are unintentional.    Contact & Pharmacy Preferred: [phone] Home: [phone] (home) Mobile: [phone] (mobile) E-mail: No e-mail address on record  [id number] [place], [place] - [address] [place] [address] Phone: [phone] Fax: [phone]   Pre-screening  Lawrence Pham offered "in-person" vs "virtual" encounter. He indicated preferring virtual for this encounter.   Reason COVID-19*  Social distancing based on CDC and AMA recommendations.   I contacted Lawrence Pham on 03/25/2023 via telephone.      I clearly identified myself as Edward Jolly, MD. I verified that I was speaking with the correct person using two identifiers (Name: Lawrence Pham, and date of birth: 07/21/1981).  Consent I sought verbal advanced consent from Lawrence Pham for virtual visit interactions. I informed Lawrence Pham of possible security and privacy concerns, risks, and limitations associated with providing "not-in-person" medical evaluation and management services. I also informed Lawrence Pham of the availability of "in-person" appointments. Finally, I informed him that there  would be a charge for the virtual visit and that he could be  personally, fully or partially, financially responsible for it. Lawrence Pham expressed understanding and agreed to proceed.   Historic Elements   Lawrence Pham is a 42 y.o. year old, male patient evaluated today after our last contact on 02/11/2023. Lawrence Pham  has a past medical history of Allergy, Asthma, and Chronic pain. He also  has a past surgical history that includes Mandible surgery (Left, 2005). Lawrence Pham has a current medication list which includes the following prescription(s): alfuzosin, azelastine, epinephrine, fluticasone, glycopyrrolate, mirtazapine, montelukast, diclofenac, and gabapentin. He  reports that he has never smoked. He has quit using smokeless tobacco.  His smokeless tobacco use included snuff. He reports that he does not currently use drugs. He reports that he does not drink alcohol. Lawrence Pham is allergic to nucynta [tapentadol] and tramadol.  BMI: Estimated body mass index is 17.03 kg/m as calculated from the following:   Height as of 09/25/22: 6' (1.829 m).   Weight as of 09/25/22: 125 lb 9.6 oz (57 kg).   HPI  Today, he is being contacted for medication management.  Requesting refill of Gabapentin and diclofenac.  Unfortunately, he does not have a primary care provider.  I informed him that we should be monitoring his kidney function while he is on diclofenac.  I will place an order for complete metabolic panel.  Patient can come in to our clinic to do that when he is available.    Laboratory Chemistry Profile   Renal Lab Results  Component Value Date   BUN 13 03/26/2022   CREATININE 0.88 03/26/2022   GFRNONAA >60 03/26/2022    Hepatic Lab Results  Component Value Date   AST 16 03/26/2022  ALT 15 03/26/2022   ALBUMIN 4.3 03/26/2022   ALKPHOS 53 03/26/2022    Electrolytes Lab Results  Component Value Date   NA 139 03/26/2022   K 4.1 03/26/2022   CL 106 03/26/2022   CALCIUM 9.6  03/26/2022    Bone No results found for: "VD25OH", "VD125OH2TOT", "VE9381OF7", "PZ0258NI7", "25OHVITD1", "25OHVITD2", "25OHVITD3", "TESTOFREE", "TESTOSTERONE"  Inflammation (CRP: Acute Phase) (ESR: Chronic Phase) No results found for: "CRP", "ESRSEDRATE", "LATICACIDVEN"       Note: Above Lab results reviewed.     Assessment  The primary encounter diagnosis was Bilateral primary osteoarthritis of knee. Diagnoses of Piriformis muscle pain, Bilateral hip pain, Current moderate episode of major depressive disorder, unspecified whether recurrent (HCC), and Chronic pain syndrome were also pertinent to this visit.  Plan of Care  Problem-specific:  No problem-specific Assessment & Plan notes found for this encounter.  Lawrence Pham has a current medication list which includes the following long-term medication(s): azelastine, fluticasone, glycopyrrolate, mirtazapine, montelukast, and gabapentin.  Pharmacotherapy (Medications Ordered): Meds ordered this encounter  Medications   gabapentin (NEURONTIN) 300 MG capsule    Sig: Take 1 capsule (300 mg total) by mouth 3 (three) times daily.    Dispense:  90 capsule    Refill:  5   diclofenac (VOLTAREN) 75 MG EC tablet    Sig: Take 1 tablet (75 mg total) by mouth 2 (two) times daily as needed for moderate pain (knee pain).    Dispense:  60 tablet    Refill:  5   Orders:  Orders Placed This Encounter  Procedures   Comp. Metabolic Panel (12)    With GFR. Indications: Chronic Pain Syndrome (G89.4) & Pharmacotherapy (Z79.899) Cc PCP: Pcp, No    Order Specific Question:   Has the patient fasted?    Answer:   No    Order Specific Question:   CC Results    Answer:   PCP-NURSE [[id number]]    Order Specific Question:   Release to patient    Answer:   Immediate   Follow-up plan:   Return if symptoms worsen or fail to improve.      Status post right sacroiliac joint injection, right piriformis injection Helped significantly, repeat as  needed.  09/18/20:  bilateral knee intra-articular steroid: Not helpful.  Bilateral genicular nerve block 11/20/2020.  Consider repeating SI joint/piriformis injection every 2 to 3 months. Left GN RFA 08/01/21, right genicular RFA 09/26/2021           Recent Visits No visits were found meeting these conditions. Showing recent visits within past 90 days and meeting all other requirements Today's Visits Date Type Provider Dept  03/25/23 Office Visit Edward Jolly, MD [facility]  Showing today's visits and meeting all other requirements Future Appointments No visits were found meeting these conditions. Showing future appointments within next 90 days and meeting all other requirements  I discussed the assessment and treatment plan with the patient. The patient was provided an opportunity to ask questions and all were answered. The patient agreed with the plan and demonstrated an understanding of the instructions.  Patient advised to call back or seek an in-person evaluation if the symptoms or condition worsens.  Duration of encounter: 20 minutes.  Note by: Edward Jolly, MD Date: 03/25/2023; Time: 2:37 PM

## 2023-03-29 ENCOUNTER — Other Ambulatory Visit: Payer: Self-pay | Admitting: Psychiatry

## 2023-03-29 DIAGNOSIS — F3341 Major depressive disorder, recurrent, in partial remission: Secondary | ICD-10-CM

## 2023-03-29 DIAGNOSIS — F411 Generalized anxiety disorder: Secondary | ICD-10-CM

## 2023-03-29 DIAGNOSIS — G4701 Insomnia due to medical condition: Secondary | ICD-10-CM

## 2023-06-28 ENCOUNTER — Other Ambulatory Visit: Payer: Self-pay | Admitting: Psychiatry

## 2023-06-28 DIAGNOSIS — F3341 Major depressive disorder, recurrent, in partial remission: Secondary | ICD-10-CM

## 2023-06-28 DIAGNOSIS — G4701 Insomnia due to medical condition: Secondary | ICD-10-CM

## 2023-06-28 DIAGNOSIS — F411 Generalized anxiety disorder: Secondary | ICD-10-CM

## 2023-07-01 ENCOUNTER — Telehealth: Payer: Medicare Other | Admitting: Psychiatry

## 2023-07-01 ENCOUNTER — Encounter: Payer: Self-pay | Admitting: Psychiatry

## 2023-07-01 DIAGNOSIS — F3342 Major depressive disorder, recurrent, in full remission: Secondary | ICD-10-CM | POA: Diagnosis not present

## 2023-07-01 DIAGNOSIS — F1290 Cannabis use, unspecified, uncomplicated: Secondary | ICD-10-CM

## 2023-07-01 DIAGNOSIS — F411 Generalized anxiety disorder: Secondary | ICD-10-CM | POA: Diagnosis not present

## 2023-07-01 DIAGNOSIS — G4701 Insomnia due to medical condition: Secondary | ICD-10-CM | POA: Diagnosis not present

## 2023-07-01 DIAGNOSIS — Z634 Disappearance and death of family member: Secondary | ICD-10-CM

## 2023-07-01 DIAGNOSIS — F1311 Sedative, hypnotic or anxiolytic abuse, in remission: Secondary | ICD-10-CM

## 2023-07-01 DIAGNOSIS — F1111 Opioid abuse, in remission: Secondary | ICD-10-CM

## 2023-07-01 NOTE — Progress Notes (Unsigned)
Virtual Visit via Telephone Note  I connected with Lawrence Pham on 07/01/23 at 11:40 AM EDT by telephone and verified that I am speaking with the correct person using two identifiers.  Location Provider Location : [facility] Patient Location : Home  Participants: Patient , Provider   I discussed the limitations, risks, security and privacy concerns of performing an evaluation and management service by telephone and the availability of in person appointments. I also discussed with the patient that there may be a patient responsible charge related to this service. The patient expressed understanding and agreed to proceed.   I discussed the assessment and treatment plan with the patient. The patient was provided an opportunity to ask questions and all were answered. The patient agreed with the plan and demonstrated an understanding of the instructions.   The patient was advised to call back or seek an in-person evaluation if the symptoms worsen or if the condition fails to improve as anticipated.   BH MD OP Progress Note  07/02/2023 8:27 AM Lawrence Pham  MRN:  [id number]  Chief Complaint:  Chief Complaint  Patient presents with   Follow-up   Depression   Anxiety   Medication Refill   HPI: Lawrence Pham is a 42 year old Caucasian male, married, disabled, lives in [place], has a history of MDD, GAD, insomnia, history of unintentional weight loss, long-term use of cannabis, chronic pain was evaluated by phone today.  Patient today reports he is currently doing fairly well on the current medication regimen.  Denies any significant hopelessness, low motivation, anhedonia.  Patient however reports he is grieving the loss of his grandfather who passed away in 02-15-2023.  Patient reports his grandparents raised him since his mother moved in with them when he was around 23-year-old.  Patient reports hence he was very attached to them.  That does make it difficult although he has been coping  okay.  He has been helping his mom take care of his estate.  That does keep him busy.  Patient continues to be in pain currently being managed by Dr. Cherylann Ratel.  Reports he was recently started on diclofenac.  He also is taking gabapentin.  Patient reports sleep is overall good.  Some nights he does struggle with sleep hygiene and falls asleep late.  However overall he gets good enough sleep.  Patient reports he does struggle with his eating habits, snacks a lot and hence does not have big meals.  He is currently working on that.  Patient denies any suicidality, homicidality or perceptual disturbances.  Patient denies any other concerns today.  Visit Diagnosis:    ICD-10-CM   1. MDD (major depressive disorder), recurrent, in full remission (HCC)  F33.42     2. GAD (generalized anxiety disorder)  F41.1     3. Insomnia due to medical condition  G47.01    pain, anxiety    4. Bereavement  Z63.4     5. Long term current use of cannabis  F12.90     6. History of opioid abuse (HCC)  F11.11     7. Benzodiazepine abuse in remission [facility])  F13.11       Past Psychiatric History: I have reviewed past psychiatric history from progress note on 01/11/2022.  Past trials of medications like Effexor, Valium.  Past Medical History:  Past Medical History:  Diagnosis Date   Allergy    Asthma    Chronic pain     Past Surgical History:  Procedure Laterality Date  MANDIBLE SURGERY Left 2005    Family Psychiatric History: I have reviewed family psychiatric history from progress note on 01/11/2022.  Family History:  Family History  Problem Relation Age of Onset   Drug abuse Father     Social History: I have reviewed social history from progress note on 01/11/2022. Social History   Socioeconomic History   Marital status: Married    Spouse name: Not on file   Number of children: 2   Years of education: 12   Highest education level: Some college, no degree  Occupational History   Not on  file  Tobacco Use   Smoking status: Never   Smokeless tobacco: Former    Types: Snuff  Vaping Use   Vaping status: Every Day   Substances: THC, Mixture of cannabinoids  Substance and Sexual Activity   Alcohol use: No   Drug use: Not Currently   Sexual activity: Not on file  Other Topics Concern   Not on file  Social History Narrative   Not on file   Social Determinants of Health   Financial Resource Strain: Not on file  Food Insecurity: Not on file  Transportation Needs: Not on file  Physical Activity: Not on file  Stress: Not on file  Social Connections: Not on file    Allergies:  Allergies  Allergen Reactions   Nucynta [Tapentadol] Nausea And Vomiting   Tramadol Rash    Metabolic Disorder Labs: No results found for: "HGBA1C", "MPG" No results found for: "PROLACTIN" No results found for: "CHOL", "TRIG", "HDL", "CHOLHDL", "VLDL", "LDLCALC" Lab Results  Component Value Date   TSH 0.798 03/26/2022    Therapeutic Level Labs: No results found for: "LITHIUM" No results found for: "VALPROATE" No results found for: "CBMZ"  Current Medications: Current Outpatient Medications  Medication Sig Dispense Refill   alfuzosin (UROXATRAL) 10 MG 24 hr tablet Take 10 mg by mouth daily.     azelastine (ASTELIN) 0.1 % nasal spray Place into both nostrils.     diclofenac (VOLTAREN) 75 MG EC tablet Take 1 tablet (75 mg total) by mouth 2 (two) times daily as needed for moderate pain (knee pain). 60 tablet 5   EPINEPHrine 0.3 mg/0.3 mL IJ SOAJ injection See admin instructions.     fluticasone (FLONASE) 50 MCG/ACT nasal spray Place into both nostrils.     gabapentin (NEURONTIN) 300 MG capsule Take 1 capsule (300 mg total) by mouth 3 (three) times daily. 90 capsule 5   glycopyrrolate (ROBINUL) 1 MG tablet Take 2 mg by mouth daily.     mirtazapine (REMERON) 45 MG tablet TAKE 1 TABLET BY MOUTH EVERYDAY AT BEDTIME 90 tablet 0   montelukast (SINGULAIR) 10 MG tablet Take 10 mg by mouth at  bedtime.     No current facility-administered medications for this visit.     Musculoskeletal: Strength & Muscle Tone:  UTA Gait & Station:  UTA Patient leans: N/A  Psychiatric Specialty Exam: Review of Systems  Psychiatric/Behavioral:         Grief    There were no vitals taken for this visit.There is no height or weight on file to calculate BMI.  General Appearance:  UTA  Eye Contact:   UTA  Speech:  Clear and Coherent  Volume:  Normal  Mood:   Grief  Affect:   UTA  Thought Process:  Goal Directed and Descriptions of Associations: Intact  Orientation:  Full (Time, Place, and Person)  Thought Content: Logical   Suicidal Thoughts:  No  Homicidal Thoughts:  No  Memory:  Immediate;   Fair Recent;   Fair Remote;   Fair  Judgement:  Fair  Insight:  Fair  Psychomotor Activity:  Normal  Concentration:  Concentration: Fair and Attention Span: Fair  Recall:  [employer] of Knowledge: Fair  Language: Fair  Akathisia:  No  Handed:  Right  AIMS (if indicated): not done  Assets:  Communication Skills Desire for Improvement Housing Social Support Transportation  ADL's:  Intact  Cognition: WNL  Sleep:  Fair   Screenings: Geneticist, molecular Office Visit from 03/18/2022 in [facility]  AIMS Total Score 0      GAD-7    Flowsheet Row Counselor from 12/09/2022 in [facility] Video Visit from 11/28/2022 in [facility] Office Visit from 09/25/2022 in [facility] Video Visit from 07/25/2022 in [facility] Video Visit from 01/11/2022 in [facility]  Total GAD-7 Score 14 10 5 12 15       PHQ2-9    Flowsheet Row Video Visit from 07/01/2023 in [facility] Counselor from 12/09/2022 in [facility] Video Visit from 11/28/2022 in [facility] Office Visit from 09/25/2022 in [facility] Video Visit from 07/25/2022 in [facility]  PHQ-2 Total Score 1 3 0 2 2  PHQ-9 Total Score -- 12 -- 10 11      Flowsheet Row Video Visit from 07/01/2023 in [facility] Video Visit from 02/27/2023 in [facility] Counselor from 12/09/2022 in [facility]  C-SSRS RISK CATEGORY No Risk No Risk No Risk        Assessment and Plan: Lawrence Pham is a 42 year old Caucasian male, married, disabled, lives in [place], has a history of depression, anxiety was evaluated by phone today.  Patient is currently grieving the loss of his grandfather, although coping well, will benefit from continued medication management.  Plan as noted below.  Plan MDD in remission Mirtazapine 45 mg p.o. nightly  GAD-stable Mirtazapine 45 mg p.o. nightly Patient was referred for CBT in the past however has been noncompliant.  Insomnia-improving Patient to work on sleep hygiene techniques Will need sufficient pain management.  Patient to continue to follow-up with Dr.Lateef. Patient to continue mirtazapine which helps with sleep also.  Bereavement-improving Provided brief supportive therapy.  Patient was referred for counseling in the past however has been noncompliant.  Patient to let writer know if interested.  Long-term use of cannabis-Will monitor closely  History of opiate abuse/benzodiazepine abuse in remission-patient is currently stable.  Follow-up in clinic in 4 to 5 months in person.    I have spent at least 20 minutes non face to face with patient today.     Consent: Patient/Guardian gives verbal consent for treatment and assignment of  benefits for services provided during this visit. Patient/Guardian expressed understanding and agreed to proceed.   This note was generated in part or whole with voice recognition software. Voice recognition is usually quite accurate but there are transcription errors that can and very often do occur. I apologize for any typographical errors that were not detected and corrected.    Jomarie Longs, MD 07/02/2023, 8:27 AM

## 2023-11-01 ENCOUNTER — Other Ambulatory Visit: Payer: Self-pay | Admitting: Student in an Organized Health Care Education/Training Program

## 2023-11-01 DIAGNOSIS — F321 Major depressive disorder, single episode, moderate: Secondary | ICD-10-CM

## 2023-11-01 DIAGNOSIS — M7918 Myalgia, other site: Secondary | ICD-10-CM

## 2023-11-01 DIAGNOSIS — M170 Bilateral primary osteoarthritis of knee: Secondary | ICD-10-CM

## 2023-11-01 DIAGNOSIS — M25551 Pain in right hip: Secondary | ICD-10-CM

## 2023-11-01 DIAGNOSIS — G894 Chronic pain syndrome: Secondary | ICD-10-CM

## 2023-11-13 ENCOUNTER — Telehealth: Payer: Self-pay | Admitting: Psychiatry

## 2023-11-13 DIAGNOSIS — G4701 Insomnia due to medical condition: Secondary | ICD-10-CM

## 2023-11-13 DIAGNOSIS — F3341 Major depressive disorder, recurrent, in partial remission: Secondary | ICD-10-CM

## 2023-11-13 DIAGNOSIS — F411 Generalized anxiety disorder: Secondary | ICD-10-CM

## 2023-11-13 MED ORDER — MIRTAZAPINE 45 MG PO TABS: 45.0000 mg | ORAL_TABLET | Freq: Every day | ORAL | 0 refills | Status: DC

## 2023-11-13 NOTE — Telephone Encounter (Signed)
Patient called in to schedule a follow up and request a refill of mirtazapine (REMERON) 45 MG tablet to be sent to  [id number] - [place], [place] - [address]   He is out of his medication.

## 2023-12-08 ENCOUNTER — Telehealth (HOSPITAL_COMMUNITY): Payer: Self-pay

## 2023-12-08 ENCOUNTER — Other Ambulatory Visit: Payer: Self-pay | Admitting: Psychiatry

## 2023-12-08 DIAGNOSIS — F411 Generalized anxiety disorder: Secondary | ICD-10-CM

## 2023-12-08 DIAGNOSIS — G4701 Insomnia due to medical condition: Secondary | ICD-10-CM

## 2023-12-08 DIAGNOSIS — F3341 Major depressive disorder, recurrent, in partial remission: Secondary | ICD-10-CM

## 2023-12-08 MED ORDER — MIRTAZAPINE 45 MG PO TABS: 45.0000 mg | ORAL_TABLET | Freq: Every day | ORAL | 0 refills | Status: DC

## 2023-12-08 NOTE — Telephone Encounter (Signed)
Ordered

## 2023-12-08 NOTE — Telephone Encounter (Signed)
Medication refill - Fax from [facility]'s [facility] for a new Mirtazapine 45 mg order, last provided for 30 days on 11/13/23 and pt returns next on 12/18/23, last seen 07/01/23.

## 2023-12-18 ENCOUNTER — Telehealth: Payer: Medicare Other | Admitting: Psychiatry

## 2023-12-18 ENCOUNTER — Encounter: Payer: Self-pay | Admitting: Psychiatry

## 2023-12-18 DIAGNOSIS — F3342 Major depressive disorder, recurrent, in full remission: Secondary | ICD-10-CM | POA: Diagnosis not present

## 2023-12-18 DIAGNOSIS — F1290 Cannabis use, unspecified, uncomplicated: Secondary | ICD-10-CM

## 2023-12-18 DIAGNOSIS — F3341 Major depressive disorder, recurrent, in partial remission: Secondary | ICD-10-CM | POA: Insufficient documentation

## 2023-12-18 DIAGNOSIS — Z634 Disappearance and death of family member: Secondary | ICD-10-CM

## 2023-12-18 DIAGNOSIS — F1111 Opioid abuse, in remission: Secondary | ICD-10-CM

## 2023-12-18 DIAGNOSIS — F1311 Sedative, hypnotic or anxiolytic abuse, in remission: Secondary | ICD-10-CM

## 2023-12-18 DIAGNOSIS — G4701 Insomnia due to medical condition: Secondary | ICD-10-CM | POA: Diagnosis not present

## 2023-12-18 DIAGNOSIS — F411 Generalized anxiety disorder: Secondary | ICD-10-CM | POA: Diagnosis not present

## 2023-12-18 MED ORDER — MIRTAZAPINE 45 MG PO TABS: 45.0000 mg | ORAL_TABLET | Freq: Every day | ORAL | 0 refills | Status: DC

## 2023-12-18 MED ORDER — HYDROXYZINE PAMOATE 25 MG PO CAPS: 25.0000 mg | ORAL_CAPSULE | Freq: Three times a day (TID) | ORAL | 1 refills | Status: DC | PRN

## 2023-12-18 NOTE — Progress Notes (Signed)
Virtual Visit via Video Note  I connected with Lawrence Pham on 12/18/23 at  8:30 AM EST by a video enabled telemedicine application and verified that I am speaking with the correct person using two identifiers.  Location Provider Location : [facility] Patient Location : Home  Participants: Patient , Provider   I discussed the limitations of evaluation and management by telemedicine and the availability of in person appointments. The patient expressed understanding and agreed to proceed.    I discussed the assessment and treatment plan with the patient. The patient was provided an opportunity to ask questions and all were answered. The patient agreed with the plan and demonstrated an understanding of the instructions.   The patient was advised to call back or seek an in-person evaluation if the symptoms worsen or if the condition fails to improve as anticipated.   BH MD OP Progress Note  12/18/2023 10:05 PM Lawrence Pham  MRN:  [id number]  Chief Complaint:  Chief Complaint  Patient presents with   Follow-up   Depression   Anxiety   Medication Refill   HPI: Lawrence Pham is a 43 year old Caucasian male, married, disabled, lives in [place], has a history of MDD, GAD, insomnia, history of unintentional weight loss, long-term use of cannabis, chronic pain was evaluated by telemedicine today.  Patient today presents with worsening panic attacks.  He has been experiencing panic attacks over the past few weeks, primarily at night, characterized by rapid heartbeat, a sensation of claustrophobia, and difficulty breathing, often waking him from sleep. He describes these episodes as 'real bad' recently. He is currently taking mirtazapine 45 mg nightly. No significant sadness, hopelessness, or suicidal thoughts, but he feels down and worried, particularly about his wife. His appetite is inconsistent, which he attributes to his depression, noting a tendency to eat less when feeling  down.  Significant stressors include his father's multiple health issues, such as COPD, lung cancer, and congestive heart failure, and his wife's upcoming medical procedure. He wonders if these stressors contribute to his panic attacks.  Chronic sinus issues have been exacerbated by recent weather changes, leading to nasal congestion. He uses Astelin and Flonase nasal sprays as needed and believes his sinus problems may contribute to his panic attacks, as they often occur when his nose is congested.   He also uses cannabis products, specifically THC gummies and CBD drops, which he acknowledges may affect his mood and sleep  Visit Diagnosis:    ICD-10-CM   1. MDD (major depressive disorder), recurrent, in full remission (HCC)  F33.42     2. GAD (generalized anxiety disorder)  F41.1 hydrOXYzine (VISTARIL) 25 MG capsule    mirtazapine (REMERON) 45 MG tablet    3. Insomnia due to medical condition  G47.01    pain, anxiety    4. Bereavement  Z63.4     5. Long term current use of cannabis  F12.90     6. History of opioid abuse (HCC)  F11.11     7. Benzodiazepine abuse in remission [facility])  F13.11       Past Psychiatric History: I have reviewed past psychiatric history from progress note on 01/11/2022.  Past trials of medications like Effexor, Valium.  Past Medical History:  Past Medical History:  Diagnosis Date   Allergy    Asthma    Chronic pain     Past Surgical History:  Procedure Laterality Date   MANDIBLE SURGERY Left 2005    Family Psychiatric History: I have reviewed  family psychiatric history from progress note on 01/11/2022.  Family History:  Family History  Problem Relation Age of Onset   Drug abuse Father     Social History: I have reviewed social history from progress note on 01/11/2022. Social History   Socioeconomic History   Marital status: Married    Spouse name: Not on file   Number of children: 2   Years of education: 12   Highest education level: Some  college, no degree  Occupational History   Not on file  Tobacco Use   Smoking status: Never   Smokeless tobacco: Former    Types: Snuff  Vaping Use   Vaping status: Every Day   Substances: THC, Mixture of cannabinoids  Substance and Sexual Activity   Alcohol use: No   Drug use: Not Currently   Sexual activity: Not on file  Other Topics Concern   Not on file  Social History Narrative   Not on file   Social Drivers of Health   Financial Resource Strain: Not on file  Food Insecurity: Not on file  Transportation Needs: Not on file  Physical Activity: Not on file  Stress: Not on file  Social Connections: Not on file    Allergies:  Allergies  Allergen Reactions   Nucynta [Tapentadol] Nausea And Vomiting   Tramadol Rash    Metabolic Disorder Labs: No results found for: "HGBA1C", "MPG" No results found for: "PROLACTIN" No results found for: "CHOL", "TRIG", "HDL", "CHOLHDL", "VLDL", "LDLCALC" Lab Results  Component Value Date   TSH 0.798 03/26/2022    Therapeutic Level Labs: No results found for: "LITHIUM" No results found for: "VALPROATE" No results found for: "CBMZ"  Current Medications: Current Outpatient Medications  Medication Sig Dispense Refill   alfuzosin (UROXATRAL) 10 MG 24 hr tablet Take 10 mg by mouth daily.     azelastine (ASTELIN) 0.1 % nasal spray Place into both nostrils.     diclofenac (VOLTAREN) 75 MG EC tablet Take 1 tablet (75 mg total) by mouth 2 (two) times daily as needed for moderate pain (knee pain). 60 tablet 5   EPINEPHrine 0.3 mg/0.3 mL IJ SOAJ injection See admin instructions.     fluticasone (FLONASE) 50 MCG/ACT nasal spray Place into both nostrils.     gabapentin (NEURONTIN) 300 MG capsule Take 1 capsule (300 mg total) by mouth 3 (three) times daily. 90 capsule 5   glycopyrrolate (ROBINUL) 1 MG tablet Take 2 mg by mouth daily.     hydrOXYzine (VISTARIL) 25 MG capsule Take 1 capsule (25 mg total) by mouth 3 (three) times daily as needed  for anxiety. 90 capsule 1   montelukast (SINGULAIR) 10 MG tablet Take 10 mg by mouth at bedtime.     mirtazapine (REMERON) 45 MG tablet Take 1 tablet (45 mg total) by mouth at bedtime. 90 tablet 0   No current facility-administered medications for this visit.     Musculoskeletal: Strength & Muscle Tone:  UTA Gait & Station:  Seated Patient leans: N/A  Psychiatric Specialty Exam: Review of Systems  Psychiatric/Behavioral:  Positive for sleep disturbance. The patient is nervous/anxious.     There were no vitals taken for this visit.There is no height or weight on file to calculate BMI.  General Appearance: Fairly Groomed  Eye Contact:  Fair  Speech:  Clear and Coherent  Volume:  Normal  Mood:  Anxious  Affect:  Congruent  Thought Process:  Goal Directed and Descriptions of Associations: Intact  Orientation:  Full (Time, Place,  and Person)  Thought Content: Logical   Suicidal Thoughts:  No  Homicidal Thoughts:  No  Memory:  Immediate;   Fair Recent;   Fair Remote;   Fair  Judgement:  Fair  Insight:  Fair  Psychomotor Activity:  Normal  Concentration:  Concentration: Fair and Attention Span: Fair  Recall:  [employer] of Knowledge: Fair  Language: Fair  Akathisia:  No  Handed:  Right  AIMS (if indicated): not done  Assets:  Desire for Improvement Housing Social Support  ADL's:  Intact  Cognition: WNL  Sleep:   restless   Screenings: AIMS    Flowsheet Row Office Visit from 03/18/2022 in [facility]  AIMS Total Score 0      GAD-7    Flowsheet Row Counselor from 12/09/2022 in [facility] Video Visit from 11/28/2022 in [facility] Office Visit from 09/25/2022 in [facility] Video Visit from 07/25/2022 in [facility] Video Visit from 01/11/2022 in [facility]  Total GAD-7 Score 14 10 5 12 15       PHQ2-9    Flowsheet Row Video Visit from 07/01/2023 in [facility] Counselor from 12/09/2022 in [facility] Video Visit from 11/28/2022 in [facility] Office Visit from 09/25/2022 in [facility] Video Visit from 07/25/2022 in [facility]  PHQ-2 Total Score 1 3 0 2 2  PHQ-9 Total Score -- 12 -- 10 11      Flowsheet Row Video Visit from 12/18/2023 in [facility] Video Visit from 07/01/2023 in [facility] Video Visit from 02/27/2023 in [facility]  C-SSRS RISK CATEGORY No Risk No Risk No Risk        Assessment and Plan: ALVEY BROCKEL is a 43 year old Caucasian male, married, disabled, [place], has a history of depression, anxiety was evaluated by telemedicine today.  Patient is currently  Panic Attacks Daine reports severe nocturnal panic attacks with symptoms of tachycardia, dyspnea, and claustrophobia, likely exacerbated by recent stressors including his father's health issues and his wife's upcoming procedure. Despite using coping strategies, he continues to experience significant distress. Hydroxyzine was discussed as an as-needed medication, with emphasis on the importance of coping strategies and the potential for dependency if used daily. - Prescribe hydroxyzine 25 mg TID PRN for panic attacks. - Encourage continued use of coping strategies such as breathing techniques and distractions.  Anxiety Disorder Honest reports increased anxiety due to recent life stressors and cannabis use, which may contribute to mood symptoms and sleep disturbances. The potential negative effects of THC on  mood and sleep were discussed. - Discuss the potential negative effects of THC on mood and sleep. - Refer to a therapist for additional support.  Major Depression in remission Ramondo is currently on mirtazapine 45 mg for major depression. He reports feeling down but denies significant sadness, hopelessness, or suicidal ideation. His appetite remains inconsistent, and he is dealing with grief from his grandfather's passing. - Continue mirtazapine 45 mg. - Refer to a therapist for additional support.  Bereavement - Improving  Currently denies any worsening grief symptoms. - Refer to therapist as noted above    Follow-up - Schedule an appointment with a therapist. -  Next appointment on March 6th at 9:20 AM at the new office location.   Collaboration of Care: Collaboration of Care: Referral or follow-up with counselor/therapist [facility]EB Communicated with staff to schedule patient with therapist.  Patient/Guardian was advised Release of Information must be obtained prior to any record release in order to collaborate their care with an outside provider. Patient/Guardian was advised if they have not already done so to contact the registration department to sign all necessary forms in order for [place] to release information regarding their care.   Consent: Patient/Guardian gives verbal consent for treatment and assignment of benefits for services provided during this visit. Patient/Guardian expressed understanding and agreed to proceed.   This note was generated in part or whole with voice recognition software. Voice recognition is usually quite accurate but there are transcription errors that can and very often do occur. I apologize for any typographical errors that were not detected and corrected.    Lawrence Longs, MD 12/18/2023, 10:05 PM

## 2024-01-09 ENCOUNTER — Other Ambulatory Visit: Payer: Self-pay | Admitting: Psychiatry

## 2024-01-09 DIAGNOSIS — F411 Generalized anxiety disorder: Secondary | ICD-10-CM

## 2024-01-22 ENCOUNTER — Ambulatory Visit: Payer: Medicare Other | Admitting: Psychiatry

## 2024-01-22 ENCOUNTER — Encounter: Payer: Self-pay | Admitting: Psychiatry

## 2024-01-22 VITALS — BP 124/76 | HR 88 | Temp 98.6°F | Ht 72.0 in | Wt 134.4 lb

## 2024-01-22 DIAGNOSIS — F411 Generalized anxiety disorder: Secondary | ICD-10-CM

## 2024-01-22 DIAGNOSIS — G4701 Insomnia due to medical condition: Secondary | ICD-10-CM | POA: Diagnosis not present

## 2024-01-22 DIAGNOSIS — F331 Major depressive disorder, recurrent, moderate: Secondary | ICD-10-CM | POA: Diagnosis not present

## 2024-01-22 DIAGNOSIS — F1111 Opioid abuse, in remission: Secondary | ICD-10-CM

## 2024-01-22 DIAGNOSIS — F1290 Cannabis use, unspecified, uncomplicated: Secondary | ICD-10-CM

## 2024-01-22 DIAGNOSIS — Z634 Disappearance and death of family member: Secondary | ICD-10-CM | POA: Diagnosis not present

## 2024-01-22 DIAGNOSIS — F1311 Sedative, hypnotic or anxiolytic abuse, in remission: Secondary | ICD-10-CM

## 2024-01-22 MED ORDER — ESCITALOPRAM OXALATE 5 MG PO TABS: 5.0000 mg | ORAL_TABLET | Freq: Every day | ORAL | 1 refills | Status: DC

## 2024-01-22 NOTE — Progress Notes (Signed)
 [facility] MD OP Progress Note  01/22/2024 9:56 AM [name]  MRN:  [id number]  Chief Complaint:  Chief Complaint  Patient presents with   Follow-up   Depression   Anxiety   Medication Refill   HPI: Lawrence Pham is a 43 year old Caucasian male, married, disabled, lives in [place], has a history of MDD, GAD, insomnia, history of unintentional weight loss, long-term use of cannabis, chronic pain was evaluated in office today.  Patient today reports he currently struggles with depression symptoms including low mood, concentration problem, low energy.  Denies any suicidality, homicidality or perceptual disturbances.  He continues to have anxiety symptoms including panic attacks especially nocturnal panic symptoms.  He does have hydroxyzine available however he has not been using it much.  He does not know if it helps.  He does have upcoming appointment scheduled with therapist and is motivated to keep it.  Patient reports he struggles with chronic pain and he uses cannabis on a regular basis to control his pain.  He is aware about the long-term side effects of cannabis however does not want to stop using it.  Patient concerned about appetite reduction and the fact that his insurance does not cover a nutritionist.  He does not have a primary care provider at this time however is looking into finding one.  Discussed initiation of olanzapine however discussed side effects and the need for monitoring EKG as well as  metabolic panel.  Patient declines stating he does not want to start medications like that at this time.  Patient however agreeable to trial of Lexapro.  Currently compliant on the mirtazapine.  Denies side effects.    Visit Diagnosis:    ICD-10-CM   1. MDD (major depressive disorder), recurrent episode, moderate (HCC)  F33.1 escitalopram (LEXAPRO) 5 MG tablet    2. GAD (generalized anxiety disorder)  F41.1 escitalopram (LEXAPRO) 5 MG tablet    3. Insomnia due to medical  condition  G47.01    Pain, anxiety    4. Bereavement  Z63.4     5. Long term current use of cannabis  F12.90     6. History of opioid abuse (HCC)  F11.11     7. Benzodiazepine abuse in remission [facility])  F13.11       Past Psychiatric History: I have reviewed past psychiatric history from progress note on 01/11/2022.  Past trials of medications like Effexor, Valium.  Past Medical History:  Past Medical History:  Diagnosis Date   Allergy    Asthma    Chronic pain     Past Surgical History:  Procedure Laterality Date   MANDIBLE SURGERY Left 2005    Family Psychiatric History: I have reviewed family psychiatric history from progress note on 01/11/2022.  Family History:  Family History  Problem Relation Age of Onset   Drug abuse Father     Social History: I have reviewed social history from progress note on 01/11/2022. Social History   Socioeconomic History   Marital status: Married    Spouse name: Not on file   Number of children: 2   Years of education: 12   Highest education level: Some college, no degree  Occupational History   Not on file  Tobacco Use   Smoking status: Never   Smokeless tobacco: Former    Types: Snuff  Vaping Use   Vaping status: Every Day   Substances: THC, Mixture of cannabinoids  Substance and Sexual Activity   Alcohol use: No   Drug use:  Not Currently   Sexual activity: Not on file  Other Topics Concern   Not on file  Social History Narrative   Not on file   Social Drivers of Health   Financial Resource Strain: Not on file  Food Insecurity: Not on file  Transportation Needs: Not on file  Physical Activity: Not on file  Stress: Not on file  Social Connections: Not on file    Allergies:  Allergies  Allergen Reactions   Nucynta [Tapentadol] Nausea And Vomiting   Tramadol Rash    Metabolic Disorder Labs: No results found for: "HGBA1C", "MPG" No results found for: "PROLACTIN" No results found for: "CHOL", "TRIG", "HDL",  "CHOLHDL", "VLDL", "LDLCALC" Lab Results  Component Value Date   TSH 0.798 03/26/2022    Therapeutic Level Labs: No results found for: "LITHIUM" No results found for: "VALPROATE" No results found for: "CBMZ"  Current Medications: Current Outpatient Medications  Medication Sig Dispense Refill   alfuzosin (UROXATRAL) 10 MG 24 hr tablet Take 10 mg by mouth daily.     azelastine (ASTELIN) 0.1 % nasal spray Place into both nostrils.     diclofenac (VOLTAREN) 75 MG EC tablet Take 1 tablet (75 mg total) by mouth 2 (two) times daily as needed for moderate pain (knee pain). 60 tablet 5   EPINEPHrine 0.3 mg/0.3 mL IJ SOAJ injection See admin instructions.     escitalopram (LEXAPRO) 5 MG tablet Take 1 tablet (5 mg total) by mouth daily with breakfast. 30 tablet 1   fluticasone (FLONASE) 50 MCG/ACT nasal spray Place into both nostrils.     gabapentin (NEURONTIN) 300 MG capsule Take 1 capsule (300 mg total) by mouth 3 (three) times daily. 90 capsule 5   glycopyrrolate (ROBINUL) 1 MG tablet Take 2 mg by mouth daily.     hydrOXYzine (VISTARIL) 25 MG capsule TAKE 1 CAPSULE BY MOUTH 3 TIMES DAILY AS NEEDED FOR ANXIETY. 270 capsule 1   mirtazapine (REMERON) 45 MG tablet Take 1 tablet (45 mg total) by mouth at bedtime. 90 tablet 0   montelukast (SINGULAIR) 10 MG tablet Take 10 mg by mouth at bedtime.     No current facility-administered medications for this visit.     Musculoskeletal: Strength & Muscle Tone: within normal limits Gait & Station:  Walks with cane Patient leans: N/A  Psychiatric Specialty Exam: Review of Systems  Psychiatric/Behavioral:  Positive for dysphoric mood and sleep disturbance. The patient is nervous/anxious.     Blood pressure 124/76, pulse 88, temperature 98.6 F (37 C), temperature source Temporal, height 6' (1.829 m), weight 134 lb 6.4 oz (61 kg), SpO2 97%.Body mass index is 18.23 kg/m.  General Appearance: Fairly Groomed  Eye Contact:  Fair  Speech:  Clear and  Coherent  Volume:  Normal  Mood:  Anxious and Depressed  Affect:  Congruent  Thought Process:  Goal Directed and Descriptions of Associations: Intact  Orientation:  Full (Time, Place, and Person)  Thought Content: Logical   Suicidal Thoughts:  No  Homicidal Thoughts:  No  Memory:  Immediate;   Fair Recent;   Fair Remote;   Fair  Judgement:  Fair  Insight:  Fair  Psychomotor Activity:  Normal  Concentration:  Concentration: Fair and Attention Span: Fair  Recall:  [employer] of Knowledge: Fair  Language: Fair  Akathisia:  No  Handed:  Left  AIMS (if indicated): not done  Assets:  Desire for Improvement Housing Social Support  ADL's:  Intact  Cognition: WNL  Sleep:  restless   Screenings: AIMS    Flowsheet Row Office Visit from 03/18/2022 in [facility]  AIMS Total Score 0      GAD-7    Flowsheet Row Office Visit from 01/22/2024 in [facility] Counselor from 12/09/2022 in [facility] Video Visit from 11/28/2022 in [facility] Office Visit from 09/25/2022 in [facility] Video Visit from 07/25/2022 in [facility]  Total GAD-7 Score 8 14 10 5 12       PHQ2-9    Flowsheet Row Office Visit from 01/22/2024 in [facility] Video Visit from 07/01/2023 in [facility] Counselor from 12/09/2022 in [facility] Video Visit from 11/28/2022 in [facility] Office Visit from 09/25/2022 in [facility]  PHQ-2 Total Score 1 1 3  0 2  PHQ-9 Total Score 12 -- 12 -- 10      Flowsheet Row Office Visit from 01/22/2024 in [facility] Video Visit from 12/18/2023 in [facility] Video Visit from 07/01/2023 in [facility]  C-SSRS RISK CATEGORY No Risk No Risk No Risk        Assessment and Plan: YECHESKEL KUREK is a 43 year old Caucasian male, presents with worsening mood symptoms, discussed assessment and plan as noted below.  Generalized anxiety disorder/Panic attacks-unstable Dexter continues to report nocturnal panic attacks exacerbated by stressors including father's health issues.  Motivated to start psychotherapy with Ms. Pricilla Loveless which is coming up.  Agreeable to initiation of Lexapro. - Start Lexapro 5 mg daily - Provided medication education including GI side effects. - Continue Hydroxyzine 25 mg 3 times a day as needed for panic attacks. - Encouraged to establish care with therapist and to keep upcoming appointment.  Major depression-unstable Currently reports symptoms of reduced appetite which has been chronic, sadness due to current stressors, concentration problem and sleep issues.  Discussed initiation of olanzapine to target mood symptoms as well as appetite reduction.  Patient declines. - Patient encouraged to establish care with therapist. - Start Lexapro 5 mg daily - Continue Mirtazapine 45 mg at bedtime  Insomnia-unstable Currently reports restlessness at night likely also contributed by anxiety symptoms. - Could use Hydroxyzine 25 mg at bedtime as needed. - Continue to work on sleep hygiene and will manage pain management.  Bereavement-improving Currently denies any concerns. - We will reevaluate in future sessions.  Long-term use of cannabis-unstable Cloyde  uses cannabis for pain control.  Not interested in cutting back or quitting at this time. - Provided education about long-term risk, adverse side effects, interaction with medication.   Follow-up - Follow-up in clinic in 3 to 4 weeks or sooner in  person.   Collaboration of Care: Collaboration of Care: Referral or follow-up with counselor/therapist AEB patient encouraged to keep scheduled appointment with in-house therapist.  Patient/Guardian was advised Release of Information must be obtained prior to any record release in order to collaborate their care with an outside provider. Patient/Guardian was advised if they have not already done so to contact the registration department to sign all necessary forms in order for [place] to release information regarding their care.   Consent: Patient/Guardian gives verbal consent for treatment and assignment of benefits for services provided during this visit. Patient/Guardian  expressed understanding and agreed to proceed.  This note was generated in part or whole with voice recognition software. Voice recognition is usually quite accurate but there are transcription errors that can and very often do occur. I apologize for any typographical errors that were not detected and corrected.     Jomarie Longs, MD 01/23/2024, 8:17 AM

## 2024-01-22 NOTE — Patient Instructions (Signed)
 Escitalopram Tablets What is this medication? ESCITALOPRAM (es sye TAL oh pram) treats depression and anxiety. It increases the amount of serotonin in the brain, a hormone that helps regulate mood. It belongs to a group of medications called SSRIs. This medicine may be used for other purposes; ask your health care provider or pharmacist if you have questions. COMMON BRAND NAME(S): Lexapro What should I tell my care team before I take this medication? They need to know if you have any of these conditions: Bipolar disorder or a family history of bipolar disorder Diabetes Glaucoma Heart disease Kidney disease Liver disease Receiving electroconvulsive therapy Seizures Suicidal thoughts, plans, or attempt by you or a family member An unusual or allergic reaction to escitalopram, other medications, foods, dyes, or preservatives Pregnant or trying to become pregnant Breastfeeding How should I use this medication? Take this medication by mouth with a glass of water. Take it as directed on the prescription label at the same time every day. You can take it with or without food. If it upsets your stomach, take it with food. Do not take it more often than directed. Do not stop taking this medication suddenly except upon the advice of your care team. Stopping this medication too quickly may cause serious side effects or your condition may worsen. A special MedGuide will be given to you by the pharmacist with each prescription and refill. Be sure to read this information carefully each time. Talk to your care team about the use of this medication in children. Special care may be needed. Overdosage: If you think you have taken too much of this medicine contact a poison control center or emergency room at once. NOTE: This medicine is only for you. Do not share this medicine with others. What if I miss a dose? If you miss a dose, take it as soon as you can. If it is almost time for your next dose, take only  that dose. Do not take double or extra doses. What may interact with this medication? Do not take this medication with any of the following: Certain medications for fungal infections, such as fluconazole, itraconazole, ketoconazole, posaconazole, voriconazole Cisapride Citalopram Dronedarone Linezolid MAOIs, such as Carbex, Eldepryl, Marplan, Nardil, and Parnate Methylene blue (injected into a vein) Pimozide Thioridazine This medication may also interact with the following: Alcohol Amphetamines Aspirin and aspirin-like medications Carbamazepine Certain medications for mental health conditions Certain medications for migraine headache, such as almotriptan, eletriptan, frovatriptan, naratriptan, rizatriptan, sumatriptan, zolmitriptan Certain medications for sleep Certain medications that treat or prevent blood clots, such as warfarin, enoxaparin, dalteparin Cimetidine Diuretics Dofetilide Fentanyl Furazolidone Isoniazid Lithium Metoprolol NSAIDs, medications for pain and inflammation, such as ibuprofen or naproxen Other medications that cause heart rhythm changes Procarbazine Rasagiline Supplements, such as St. John's wort, kava kava, valerian Tramadol Tryptophan Ziprasidone This list may not describe all possible interactions. Give your health care provider a list of all the medicines, herbs, non-prescription drugs, or dietary supplements you use. Also tell them if you smoke, drink alcohol, or use illegal drugs. Some items may interact with your medicine. What should I watch for while using this medication? Tell your care team if your symptoms do not get better or if they get worse. Visit your care team for regular checks on your progress. Because it may take several weeks to see the full effects of this medication, it is important to continue your treatment as prescribed by your care team. Watch for new or worsening thoughts of suicide or depression.  This includes sudden  changes in mood, behaviors, or thoughts. These changes can happen at any time but are more common in the beginning of treatment or after a change in dose. Call your care team right away if you experience these thoughts or worsening depression. This medication may cause mood and behavior changes, such as anxiety, nervousness, irritability, hostility, restlessness, excitability, hyperactivity, or trouble sleeping. These changes can happen at any time but are more common in the beginning of treatment or after a change in dose. Call your care team right away if you notice any of these symptoms. This medication may affect your coordination, reaction time, or judgment. Do not drive or operate machinery until you know how this medication affects you. Sit up or stand slowly to reduce the risk of dizzy or fainting spells. Drinking alcohol with this medication can increase the risk of these side effects. Your mouth may get dry. Chewing sugarless gum or sucking hard candy and drinking plenty of water may help. Contact your care team if the problem does not go away or is severe. What side effects may I notice from receiving this medication? Side effects that you should report to your care team as soon as possible: Allergic reactions--skin rash, itching, hives, swelling of the face, lips, tongue, or throat Bleeding--bloody or black, tar-like stools, red or dark brown urine, vomiting blood or brown material that looks like coffee grounds, small, red or purple spots on skin, unusual bleeding or bruising Heart rhythm changes--fast or irregular heartbeat, dizziness, feeling faint or lightheaded, chest pain, trouble breathing Low sodium level--muscle weakness, fatigue, dizziness, headache, confusion Serotonin syndrome--irritability, confusion, fast or irregular heartbeat, muscle stiffness, twitching muscles, sweating, high fever, seizure, chills, vomiting, diarrhea Sudden eye pain or change in vision such as blurry vision,  seeing halos around lights, vision loss Thoughts of suicide or self-harm, worsening mood, feelings of depression Side effects that usually do not require medical attention (report to your care team if they continue or are bothersome): Change in sex drive or performance Diarrhea Excessive sweating Nausea Tremors or shaking Upset stomach This list may not describe all possible side effects. Call your doctor for medical advice about side effects. You may report side effects to FDA at 1-800-FDA-1088. Where should I keep my medication? Keep out of reach of children and pets. Store at room temperature between 15 and 30 degrees C (59 and 86 degrees F). Throw away any unused medication after the expiration date. NOTE: This sheet is a summary. It may not cover all possible information. If you have questions about this medicine, talk to your doctor, pharmacist, or health care provider.  2024 Elsevier/Gold Standard (2022-08-12 00:00:00)

## 2024-01-23 ENCOUNTER — Ambulatory Visit: Payer: Medicare Other | Admitting: Professional Counselor

## 2024-01-23 DIAGNOSIS — F331 Major depressive disorder, recurrent, moderate: Secondary | ICD-10-CM

## 2024-01-23 DIAGNOSIS — F411 Generalized anxiety disorder: Secondary | ICD-10-CM
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
## 2024-02-13 ENCOUNTER — Other Ambulatory Visit: Payer: Self-pay | Admitting: Psychiatry

## 2024-02-13 DIAGNOSIS — F411 Generalized anxiety disorder: Secondary | ICD-10-CM

## 2024-02-13 DIAGNOSIS — F331 Major depressive disorder, recurrent, moderate: Secondary | ICD-10-CM

## 2024-02-25 ENCOUNTER — Ambulatory Visit: Admitting: Professional Counselor

## 2024-02-25 DIAGNOSIS — F331 Major depressive disorder, recurrent, moderate: Secondary | ICD-10-CM | POA: Diagnosis not present

## 2024-02-25 DIAGNOSIS — F411 Generalized anxiety disorder: Secondary | ICD-10-CM | POA: Diagnosis not present

## 2024-02-25 NOTE — Progress Notes (Signed)
 THERAPIST PROGRESS NOTE  Virtual Visit via Video Note  I connected with Lawrence Pham on 02/25/24 at  8:00 AM EDT by a video enabled telemedicine application and verified that I am speaking with the correct person using two identifiers.  Location: Patient: Home Provider: Office   I discussed the limitations of evaluation and management by telemedicine and the availability of in person appointments. The patient expressed understanding and agreed to proceed.  I discussed the assessment and treatment plan with the patient. The patient was provided an opportunity to ask questions and all were answered. The patient agreed with the plan and demonstrated an understanding of the instructions.   The patient was advised to call back or seek an in-person evaluation if the symptoms worsen or if the condition fails to improve as anticipated.  I provided 43 minutes of non-face-to-face time during this encounter. Edmonia Lynch, [facility]  Session Time: 8:01 AM - 8:44 AM  Participation Level: Active  Behavioral Response: CasualAlertAnxious and Dysphoric  Type of Therapy: Individual Therapy  Treatment Goals addressed: Active Anxiety  LTG: "My main thing that gets me down is my health. Not being able to do things that a normal 43 y.o. would be able to do. Not being able to work a full-time job. That's my main thing that my whole depression sits on."    Start:  02/25/24    Expected End:  02/23/25     STG: "That's the only time that my mind's not racing (when playing music)." To improve thinking patterns AEB using mindfulness skills 3 out of 7 days a week for the next 12 weeks.    STG: "The light switches have to be the same, when I tie my shoes, I make sure the bows have to be the same." To reduce OCD behaviors AEB engaging in exposure response prevention exercises to reduce behaviors over the next 12 weeks.    STG: "I'd like to get back to at least 165 or 170. I think that new medicine has  helped my appetite." To improve wellness AEB creating a daily routine to include nutrition, exercise, and sleep over the next 12 weeks.    ProgressTowards Goals: Initial  Interventions: CBT and Supportive  Summary: Lawrence Pham is a 43 y.o. male who presents with a history of anxiety and depression. He appeared alert and oriented x5. He stated he's been busy, especially with his daughter's sports. Lawrence Pham noted the medication has been helping and he hasn't had any panic attacks. Lawrence Pham engaged in developing his treatment plan. He provided verbal permission to sign treatment plan consent form. Lawrence Pham actively listened to coping mechanisms and engaged in 5-4-3-2-1 grounding skill. He provided his email to receive copies of skills so he can practice.   Therapist Response: Conducted telehealth session with . Began session with check-in/update since previous session. Utilized empathetic and reflective listening. Developed treatment plan with input from [place] on current strengths, needs, and progress towards goals. Gained verbal permission to sign consent form. Explained coping mechanisms (breathing exercises, TIP, STOP, and RAIN) and engaged Lawrence Pham in 5-4-3-2-1 grounding mechanism. Explained cycle of avoidance/anxiety trap. Encouraged Lawrence Pham to practice skills consistently. Emailed copies of all handouts to him. Scheduled additional appointment and concluded session.   Suicidal/Homicidal: No  Plan: Return again in 2 weeks.  Diagnosis: MDD (major depressive disorder), recurrent episode, moderate (HCC)  GAD (generalized anxiety disorder)  Collaboration of Care: Medication Management AEB chart review  Patient/Guardian was advised Release of Information must be  obtained prior to any record release in order to collaborate their care with an outside provider. Patient/Guardian was advised if they have not already done so to contact the registration department to sign all necessary forms in order for [place] to release  information regarding their care.   Consent: Patient/Guardian gives verbal consent for treatment and assignment of benefits for services provided during this visit. Patient/Guardian expressed understanding and agreed to proceed.   Edmonia Lynch, [facility] 02/25/2024

## 2024-02-26 ENCOUNTER — Ambulatory Visit (INDEPENDENT_AMBULATORY_CARE_PROVIDER_SITE_OTHER): Admitting: Psychiatry

## 2024-02-26 ENCOUNTER — Encounter: Payer: Self-pay | Admitting: Psychiatry

## 2024-02-26 VITALS — BP 114/77 | HR 83 | Temp 98.1°F | Ht 72.0 in | Wt 133.8 lb

## 2024-02-26 DIAGNOSIS — Z634 Disappearance and death of family member: Secondary | ICD-10-CM | POA: Diagnosis not present

## 2024-02-26 DIAGNOSIS — F1311 Sedative, hypnotic or anxiolytic abuse, in remission: Secondary | ICD-10-CM

## 2024-02-26 DIAGNOSIS — F1290 Cannabis use, unspecified, uncomplicated: Secondary | ICD-10-CM

## 2024-02-26 DIAGNOSIS — F3341 Major depressive disorder, recurrent, in partial remission: Secondary | ICD-10-CM

## 2024-02-26 DIAGNOSIS — G4701 Insomnia due to medical condition: Secondary | ICD-10-CM

## 2024-02-26 DIAGNOSIS — F1111 Opioid abuse, in remission: Secondary | ICD-10-CM

## 2024-02-26 DIAGNOSIS — F411 Generalized anxiety disorder: Secondary | ICD-10-CM

## 2024-02-26 MED ORDER — MIRTAZAPINE 45 MG PO TABS: 45.0000 mg | ORAL_TABLET | Freq: Every day | ORAL | 1 refills | Status: DC

## 2024-02-26 NOTE — Progress Notes (Unsigned)
 BH MD OP Progress Note  02/26/2024 2:04 PM Lawrence Pham  MRN:  [id number]  Chief Complaint:  Chief Complaint  Patient presents with   Follow-up   Depression   Anxiety   Medication Refill   HPI: Lawrence Pham is a 43 year old Caucasian male, married, disabled, lives in [place], has a history of MDD, GAD, insomnia, history of unintentional weight loss, long-term use of cannabis, chronic pain was evaluated in office today for a follow-up appointment.  He has noticed an improvement in appetite since starting the new medication. He had to take hydroxyzine during the day for the first time yesterday due to a memory triggered by seeing a band that reminded him of his grandfather. No adverse effects were experienced from taking hydroxyzine during the day.  Addition of Lexapro has been beneficial.  He continues to experience discomfort in large crowds. His depression and sleep have improved, and he attributes some of his previous sleep issues to high caffeine intake. He has reduced his coffee consumption, which previously included a pot of caffeinated coffee during the day and a pot of decaf at night.  He describes having an 'addictive nature' and mentions a recent pattern of eating almond joys and combos, despite not being a regular consumer of sweets or candy. He has not noticed any significant weight gain but has observed an increase in appetite over the past two weeks. He has started eating cereal with nuts and dried strawberries in the morning, although he typically eats later in the day.  Currently compliant on medications as prescribed.  Denies side effects.  Visit Diagnosis:    ICD-10-CM   1. Recurrent major depressive disorder, in partial remission (HCC)  F33.41     2. GAD (generalized anxiety disorder)  F41.1 mirtazapine (REMERON) 45 MG tablet    3. Insomnia due to medical condition  G47.01    Pain, anxiety    4. Bereavement  Z63.4     5. Long term current use of cannabis   F12.90     6. History of opioid abuse (HCC)  F11.11     7. 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  Past trials of medications like Effexor, Valium.  Past Medical History:  Past Medical History:  Diagnosis Date   Allergy    Asthma    Chronic pain     Past Surgical History:  Procedure Laterality Date   MANDIBLE SURGERY Left 2005    Family Psychiatric History: I have reviewed family psychiatric history from progress note on 01/11/2022.  Family History:  Family History  Problem Relation Age of Onset   Drug abuse Father     Social History: I have reviewed social history from progress note on 01/11/2022. Social History   Socioeconomic History   Marital status: Married    Spouse name: Not on file   Number of children: 2   Years of education: 12   Highest education level: Some college, no degree  Occupational History   Not on file  Tobacco Use   Smoking status: Never   Smokeless tobacco: Former    Types: Snuff  Vaping Use   Vaping status: Every Day   Substances: THC, Mixture of cannabinoids  Substance and Sexual Activity   Alcohol use: No   Drug use: Not Currently   Sexual activity: Not on file  Other Topics Concern   Not  on file  Social History Narrative   Not on file   Social Drivers of Health   Financial Resource Strain: Low Risk  (01/23/2024)   Overall Financial Resource Strain (CARDIA)    Difficulty of Paying Living Expenses: Not hard at all  Food Insecurity: No Food Insecurity (01/23/2024)   Hunger Vital Sign    Worried About Running Out of Food in the Last Year: Never true    Ran Out of Food in the Last Year: Never true  Transportation Needs: Unmet Transportation Needs (01/23/2024)   PRAPARE - Transportation    Lack of Transportation (Medical): No    Lack of Transportation (Non-Medical): Yes  Physical Activity: Insufficiently Active (01/23/2024)    Exercise Vital Sign    Days of Exercise per Week: 7 days    Minutes of Exercise per Session: 10 min  Stress: Stress Concern Present (01/23/2024)   [employer] of Occupational Health - Occupational Stress Questionnaire    Feeling of Stress : Very much  Social Connections: Moderately Integrated (01/23/2024)   Social Connection and Isolation Panel [NHANES]    Frequency of Communication with Friends and Family: More than three times a week    Frequency of Social Gatherings with Friends and Family: Never    Attends Religious Services: More than 4 times per year    Active Member of [employer] or Organizations: No    Attends Banker Meetings: Never    Marital Status: Married    Allergies:  Allergies  Allergen Reactions   Nucynta [Tapentadol] Nausea And Vomiting   Tramadol Rash    Metabolic Disorder Labs: No results found for: "HGBA1C", "MPG" No results found for: "PROLACTIN" No results found for: "CHOL", "TRIG", "HDL", "CHOLHDL", "VLDL", "LDLCALC" Lab Results  Component Value Date   TSH 0.798 03/26/2022    Therapeutic Level Labs: No results found for: "LITHIUM" No results found for: "VALPROATE" No results found for: "CBMZ"  Current Medications: Current Outpatient Medications  Medication Sig Dispense Refill   alfuzosin (UROXATRAL) 10 MG 24 hr tablet Take 10 mg by mouth daily.     azelastine (ASTELIN) 0.1 % nasal spray Place into both nostrils.     diclofenac (VOLTAREN) 75 MG EC tablet Take 1 tablet (75 mg total) by mouth 2 (two) times daily as needed for moderate pain (knee pain). 60 tablet 5   EPINEPHrine 0.3 mg/0.3 mL IJ SOAJ injection See admin instructions.     escitalopram (LEXAPRO) 5 MG tablet TAKE 1 TABLET BY MOUTH EVERY DAY WITH BREAKFAST 90 tablet 1   fluticasone (FLONASE) 50 MCG/ACT nasal spray Place into both nostrils.     gabapentin (NEURONTIN) 300 MG capsule Take 1 capsule (300 mg total) by mouth 3 (three) times daily. 90 capsule 5   glycopyrrolate  (ROBINUL) 1 MG tablet Take 2 mg by mouth daily.     hydrOXYzine (VISTARIL) 25 MG capsule TAKE 1 CAPSULE BY MOUTH 3 TIMES DAILY AS NEEDED FOR ANXIETY. 270 capsule 1   mirtazapine (REMERON) 45 MG tablet Take 1 tablet (45 mg total) by mouth at bedtime. 90 tablet 1   montelukast (SINGULAIR) 10 MG tablet Take 10 mg by mouth at bedtime.     No current facility-administered medications for this visit.     Musculoskeletal: Strength & Muscle Tone: within normal limits Gait & Station:  Walks with cane Patient leans: N/A  Psychiatric Specialty Exam: Review of Systems  Psychiatric/Behavioral:  Positive for sleep disturbance (improving). The patient is nervous/anxious.     Blood pressure  114/77, pulse 83, temperature 98.1 F (36.7 C), temperature source Temporal, height 6' (1.829 m), weight 133 lb 12.8 oz (60.7 kg).Body mass index is 18.15 kg/m.  General Appearance: Casual  Eye Contact:  Fair  Speech:  Clear and Coherent  Volume:  Normal  Mood:  Anxious  Affect:  Congruent  Thought Process:  Goal Directed and Descriptions of Associations: Intact  Orientation:  Full (Time, Place, and Person)  Thought Content: Logical   Suicidal Thoughts:  No  Homicidal Thoughts:  No  Memory:  Immediate;   Fair Recent;   Fair Remote;   Fair  Judgement:  Fair  Insight:  Fair  Psychomotor Activity:  Normal  Concentration:  Concentration: Fair and Attention Span: Fair  Recall:  [employer] of Knowledge: Fair  Language: Fair  Akathisia:  No  Handed:  Left  AIMS (if indicated): not done  Assets:  Desire for Improvement Housing Social Support Transportation  ADL's:  Intact  Cognition: WNL  Sleep:   improving   Screenings: Geneticist, molecular Office Visit from 03/18/2022 in [facility]  AIMS Total Score 0      GAD-7    Flowsheet Row Office Visit from 01/22/2024 in [facility] Counselor from 12/09/2022 in [facility] Video Visit from 11/28/2022 in [facility] Office Visit from 09/25/2022 in [facility] Video Visit from 07/25/2022 in [facility]  Total GAD-7 Score 8 14 10 5 12       PHQ2-9    Flowsheet Row Office Visit from 02/26/2024 in [facility] Office Visit from 01/22/2024 in [facility] Video Visit from 07/01/2023 in [facility] Counselor from 12/09/2022 in [facility] Video Visit from 11/28/2022 in [facility]  PHQ-2 Total Score 0 1 1 3  0  PHQ-9 Total Score 1 12 -- 12 --      Flowsheet Row Office Visit from 02/26/2024 in [facility] Office Visit from 01/22/2024 in [facility]  C-SSRS RISK CATEGORY No Risk No Risk No Risk        Assessment and Plan: ASHON ROSENBERG is a 43 year old Caucasian male, has a history of depression, anxiety, insomnia was evaluated in office today, discussed assessment and plan as noted below.  Major Depressive Disorder in partial remission Improvement in depressive symptoms since starting Lexapro. Appetite and sleep quality have improved. He is maintaining therapy appointments, which is beneficial for ongoing management. - Continue Lexapro at current dose - Continue Mirtazapine 45 mg at bedtime - Encourage continuation of therapy appointments with Ms.Elizabeth Gainey.  Generalized Anxiety Disorder/panic attacks-improving Occasional use of hydroxyzine, primarily at night, with one recent daytime use without adverse effects. Anxiety symptoms persist,  particularly in large crowds, but are manageable. Attributes some restlessness to pain rather than anxiety. - Continue Hydroxyzine 25 mg 3 times a day as needed - Continue Lexapro and Mirtazapine as prescribed - Continue psychotherapy sessions.  Insomnia-improving Improved sleep quality, potentially due to reduced caffeine intake. Previously consumed large amounts of caffeinated coffee, which may have contributed to insomnia. - Advise continued reduction in caffeine intake to support improved sleep - Continue Mirtazapine as prescribed.  Long-term use  of cannabis-unstable Uses cannabis mainly for pain control.  Not interested in cutting back. - Encouraged to continue CBT.  Bereavement-improving Currently reports coping better with grief. - Continue CBT  Follow-up - Follow-up in clinic in 2 to 3 months or sooner if needed.   Collaboration of Care: Collaboration of Care: Referral or follow-up with counselor/therapist AEB encouraged to follow up with therapist.  Patient/Guardian was advised Release of Information must be obtained prior to any record release in order to collaborate their care with an outside provider. Patient/Guardian was advised if they have not already done so to contact the registration department to sign all necessary forms in order for [place] to release information regarding their care.   Consent: Patient/Guardian gives verbal consent for treatment and assignment of benefits for services provided during this visit. Patient/Guardian expressed understanding and agreed to proceed.  This note was generated in part or whole with voice recognition software. Voice recognition is usually quite accurate but there are transcription errors that can and very often do occur. I apologize for any typographical errors that were not detected and corrected.   Discussed the use of a AI scribe software for clinical note transcription with the patient, who gave verbal consent to  proceed.    Jomarie Longs, MD 02/26/2024, 2:04 PM

## 2024-03-08 ENCOUNTER — Ambulatory Visit (INDEPENDENT_AMBULATORY_CARE_PROVIDER_SITE_OTHER): Admitting: Professional Counselor

## 2024-03-08 DIAGNOSIS — F330 Major depressive disorder, recurrent, mild: Secondary | ICD-10-CM | POA: Diagnosis not present

## 2024-03-08 DIAGNOSIS — F411 Generalized anxiety disorder: Secondary | ICD-10-CM

## 2024-03-08 NOTE — Progress Notes (Signed)
 THERAPIST PROGRESS NOTE  Virtual Visit via Video Note  I connected with Verlie Glisson on 03/08/24 at  9:00 AM EDT by a video enabled telemedicine application and verified that I am speaking with the correct person using two identifiers.  Location: Patient: Home Provider: Office    I discussed the limitations of evaluation and management by telemedicine and the availability of in person appointments. The patient expressed understanding and agreed to proceed.   I discussed the assessment and treatment plan with the patient. The patient was provided an opportunity to ask questions and all were answered. The patient agreed with the plan and demonstrated an understanding of the instructions.   The patient was advised to call back or seek an in-person evaluation if the symptoms worsen or if the condition fails to improve as anticipated.  I provided 28 minutes of non-face-to-face time during this encounter. Len Quale, [facility]  Session Time: 9:01 AM - 9:29 AM  Participation Level: Active  Behavioral Response: Casual, Alert, Dysphoric  Type of Therapy: Individual Therapy  Treatment Goals addressed: Active Anxiety  LTG: "My main thing that gets me down is my health. Not being able to do things that a normal 43 y.o. would be able to do. Not being able to work a full-time job. That's my main thing that my whole depression sits on."                Start:  02/25/24    Expected End:  02/23/25      STG: "That's the only time that my mind's not racing (when playing music)." To improve thinking patterns AEB using mindfulness skills 3 out of 7 days a week for the next 12 weeks.     STG: "The light switches have to be the same, when I tie my shoes, I make sure the bows have to be the same." To reduce OCD behaviors AEB engaging in exposure response prevention exercises to reduce behaviors over the next 12 weeks.     STG: "I'd like to get back to at least 165 or 170. I think that new  medicine has helped my appetite." To improve wellness AEB creating a daily routine to include nutrition, exercise, and sleep over the next 12 weeks.    ProgressTowards Goals: Progressing  Interventions: CBT, Motivational Interviewing, and Supportive  Summary: ELDON ZIETLOW is a 43 y.o. male who presents with a history of anxiety and depression. He appeared alert and oriented x5. He stated he's been having some stomach issues, but otherwise things are going well. He reported his medication continues to be helpful. [name] engaged in writing exercise. He noted it was helpful to identify things within and outside of his control. He will try to think about this exercise when dealing with negative situations.  Therapist Response: Conducted session with [name]. Began session with check-in/update since previous session. Utilized empathetic and reflective listening. Engaged in writing exercise - donut/circles of control. Assisted with identifying things within and outside of [name]'s control. Encouraged him to focus energy on things he can control, especially when dealing with negative situations. Scheduled additional appointment and concluded session.   Suicidal/Homicidal: No  Plan: Return again in 2 weeks.  Diagnosis: GAD (generalized anxiety disorder)  MDD (major depressive disorder), recurrent episode, mild (HCC)  Collaboration of Care: Medication Management AEB chart review  Patient/Guardian was advised Release of Information must be obtained prior to any record release in order to collaborate their care with an outside provider. Patient/Guardian was  advised if they have not already done so to contact the registration department to sign all necessary forms in order for us  to release information regarding their care.   Consent: Patient/Guardian gives verbal consent for treatment and assignment of benefits for services provided during this visit. Patient/Guardian expressed understanding and agreed to  proceed.   Len Quale, [facility] 03/08/2024

## 2024-03-25 ENCOUNTER — Ambulatory Visit: Admitting: Professional Counselor

## 2024-03-29 ENCOUNTER — Ambulatory Visit (INDEPENDENT_AMBULATORY_CARE_PROVIDER_SITE_OTHER): Admitting: Urology

## 2024-03-29 VITALS — BP 110/77 | HR 86 | Ht 71.0 in | Wt 135.0 lb

## 2024-03-29 DIAGNOSIS — R339 Retention of urine, unspecified: Secondary | ICD-10-CM

## 2024-03-29 DIAGNOSIS — R399 Unspecified symptoms and signs involving the genitourinary system: Secondary | ICD-10-CM | POA: Diagnosis not present

## 2024-03-29 LAB — BLADDER SCAN AMB NON-IMAGING: Scan Result: 104

## 2024-03-29 MED ORDER — ALFUZOSIN HCL ER 10 MG PO TB24: 10.0000 mg | ORAL_TABLET | Freq: Every day | ORAL | 3 refills | Status: AC

## 2024-03-29 NOTE — Progress Notes (Signed)
 I, Maysun Jamey Mccallum, acting as a Neurosurgeon for Geraline Knapp, MD., have documented all relevant documentation on the behalf of Geraline Knapp, MD, as directed by Geraline Knapp, MD while in the presence of Geraline Knapp, MD.  Discussed the use of AI scribe software for clinical note transcription with the patient, who gave verbal consent to proceed.   03/29/2024 5:35 PM   Verlie Glisson [birth year] [id number]   Chief Complaint  Patient presents with   New Patient (Initial Visit)    HPI: Lawrence Pham is a 43 y.o. male presents to re-establish urologic care.  Previously seen by Dr. Fredrick Jenkins, who he last saw February 2022, for lower urinary tract symptoms. On alfuzosin He states he was not aware Dr. Fredrick Jenkins had retired until he contacted his office for a refill.  He has had 2 urgent care visits for refills of his alfusosin. He has baseline frequency-urgency and a weak urinary stream, which significantly improved with the alfuzosin.  Denies gross hematuria or recurrent UTIs.  He does have a history of stone disease, not requiring intervention. He has had no recent stone symptoms.   PMH: Past Medical History:  Diagnosis Date   Allergy    Asthma    Chronic pain     Surgical History: Past Surgical History:  Procedure Laterality Date   MANDIBLE SURGERY Left 2005    Home Medications:  Allergies as of 03/29/2024       Reactions   Nucynta [tapentadol] Nausea And Vomiting   Tramadol Rash        Medication List        Accurate as of Mar 29, 2024  5:35 PM. If you have any questions, ask your nurse or doctor.          alfuzosin 10 MG 24 hr tablet Commonly known as: UROXATRAL Take 1 tablet (10 mg total) by mouth daily.   azelastine 0.1 % nasal spray Commonly known as: ASTELIN Place into both nostrils.   diclofenac  75 MG EC tablet Commonly known as: VOLTAREN  Take 1 tablet (75 mg total) by mouth 2 (two) times daily as needed for moderate pain (knee pain).    EPINEPHrine 0.3 mg/0.3 mL Soaj injection Commonly known as: EPI-PEN See admin instructions.   escitalopram  5 MG tablet Commonly known as: LEXAPRO  TAKE 1 TABLET BY MOUTH EVERY DAY WITH BREAKFAST   fluticasone 50 MCG/ACT nasal spray Commonly known as: FLONASE Place into both nostrils.   gabapentin  300 MG capsule Commonly known as: NEURONTIN  Take 1 capsule (300 mg total) by mouth 3 (three) times daily.   glycopyrrolate 1 MG tablet Commonly known as: ROBINUL Take 2 mg by mouth daily.   hydrOXYzine  25 MG capsule Commonly known as: VISTARIL  TAKE 1 CAPSULE BY MOUTH 3 TIMES DAILY AS NEEDED FOR ANXIETY.   mirtazapine  45 MG tablet Commonly known as: REMERON  Take 1 tablet (45 mg total) by mouth at bedtime.   montelukast 10 MG tablet Commonly known as: SINGULAIR Take 10 mg by mouth at bedtime.        Allergies:  Allergies  Allergen Reactions   Nucynta [Tapentadol] Nausea And Vomiting   Tramadol Rash    Family History: Family History  Problem Relation Age of Onset   Drug abuse Father     Social History:  reports that he has never smoked. He has quit using smokeless tobacco.  His smokeless tobacco use included snuff. He reports that he does not currently use drugs. He  reports that he does not drink alcohol.   Physical Exam: BP 110/77   Pulse 86   Ht 5\' 11"  (1.803 m)   Wt 135 lb (61.2 kg)   BMI 18.83 kg/m   Constitutional:  Alert and oriented, No acute distress. HEENT: [place] AT Respiratory: Normal respiratory effort, no increased work of breathing. Psychiatric: Normal mood and affect.   Assessment & Plan:    1. Lower urinary tract symptoms Alfuzosin prescription refilled with a 90-day supply and refills. PVR today was 104 mL. Annual follow-up with PVR recommended. Instructed to call earlier for any worsening of voiding pattern.  I have reviewed the above documentation for accuracy and completeness, and I agree with the above.   Geraline Knapp,  MD  [facility] [address], [address] [place], [place] [address] [phone]

## 2024-03-31 ENCOUNTER — Encounter: Payer: Self-pay | Admitting: Urology

## 2024-04-06 ENCOUNTER — Ambulatory Visit
Admission: RE | Admit: 2024-04-06 | Discharge: 2024-04-06 | Disposition: A | Discharge status: Home / Self Care | Source: Ambulatory Visit | Attending: Student in an Organized Health Care Education/Training Program | Admitting: Student in an Organized Health Care Education/Training Program

## 2024-04-06 ENCOUNTER — Encounter: Payer: Self-pay | Admitting: Student in an Organized Health Care Education/Training Program

## 2024-04-06 ENCOUNTER — Ambulatory Visit (HOSPITAL_BASED_OUTPATIENT_CLINIC_OR_DEPARTMENT_OTHER)
Attending: Student in an Organized Health Care Education/Training Program | Admitting: Student in an Organized Health Care Education/Training Program

## 2024-04-06 VITALS — BP 107/62 | HR 82 | Temp 98.4°F | Resp 16 | Ht 72.0 in | Wt 140.0 lb

## 2024-04-06 DIAGNOSIS — F321 Major depressive disorder, single episode, moderate: Secondary | ICD-10-CM | POA: Insufficient documentation

## 2024-04-06 DIAGNOSIS — G894 Chronic pain syndrome: Secondary | ICD-10-CM

## 2024-04-06 DIAGNOSIS — M25512 Pain in left shoulder: Secondary | ICD-10-CM | POA: Diagnosis present

## 2024-04-06 DIAGNOSIS — M67912 Unspecified disorder of synovium and tendon, left shoulder: Secondary | ICD-10-CM

## 2024-04-06 DIAGNOSIS — M25551 Pain in right hip: Secondary | ICD-10-CM | POA: Insufficient documentation

## 2024-04-06 DIAGNOSIS — M7918 Myalgia, other site: Secondary | ICD-10-CM | POA: Insufficient documentation

## 2024-04-06 DIAGNOSIS — G8929 Other chronic pain: Secondary | ICD-10-CM | POA: Insufficient documentation

## 2024-04-06 DIAGNOSIS — M19012 Primary osteoarthritis, left shoulder: Secondary | ICD-10-CM | POA: Insufficient documentation

## 2024-04-06 DIAGNOSIS — M170 Bilateral primary osteoarthritis of knee: Secondary | ICD-10-CM | POA: Insufficient documentation

## 2024-04-06 DIAGNOSIS — M25552 Pain in left hip: Secondary | ICD-10-CM | POA: Insufficient documentation

## 2024-04-06 MED ORDER — GABAPENTIN 300 MG PO CAPS: 300.0000 mg | ORAL_CAPSULE | Freq: Three times a day (TID) | ORAL | 5 refills | Status: AC

## 2024-04-06 MED ORDER — DICLOFENAC SODIUM 75 MG PO TBEC: 75.0000 mg | DELAYED_RELEASE_TABLET | Freq: Two times a day (BID) | ORAL | 5 refills | Status: AC | PRN

## 2024-04-06 NOTE — Progress Notes (Signed)
 PROVIDER NOTE: Interpretation of information contained herein should be left to medically-trained personnel. Specific patient instructions are provided elsewhere under "Patient Instructions" section of medical record. This document was created in part using AI and STT-dictation technology, any transcriptional errors that may result from this process are unintentional.  Patient: Lawrence Pham  Service: E/M   PCP: Pcp, No  DOB: 25-Jan-1981  DOS: 04/06/2024  Provider: Cephus Collin, MD  MRN: [id number]  Delivery: Face-to-face  Specialty: Interventional Pain Management  Type: Established Patient  Setting: Ambulatory outpatient facility  Specialty designation: 09  Referring Prov.: No ref. provider found  Location: Outpatient office facility       HPI  Mr. Lawrence Pham, a 43 y.o. year old male, is here today because of his Chronic left shoulder pain [M25.512, G89.29]. Mr. Lawrence Pham primary complain today is Shoulder Pain (Right )  Pertinent problems: Mr. Lawrence Pham has Chronic pain syndrome; Chronic bilateral low back pain with bilateral sciatica; Cervicalgia; Bilateral primary osteoarthritis of knee; Bilateral hip pain; Chronic SI joint pain; and Chronic lumbar radiculopathy on their pertinent problem list. Pain Assessment: Severity of Chronic pain is reported as a 7 /10. Location: Shoulder Right/From shoulder to back of arm into right elbow. Onset: More than a month ago. Quality: Burning, Throbbing, Constant. Timing: Constant. Modifying factor(s): Denies. Vitals:  height is 6' (1.829 m) and weight is 140 lb (63.5 kg). His temporal temperature is 98.4 F (36.9 C). His blood pressure is 107/62 and his pulse is 82. His respiration is 16 and oxygen saturation is 99%.  BMI: Estimated body mass index is 18.99 kg/m as calculated from the following:   Height as of this encounter: 6' (1.829 m).   Weight as of this encounter: 140 lb (63.5 kg). Last encounter: Visit date not found. Last procedure: Visit date not  found.  Reason for encounter:   History of Present Illness   Lawrence Pham is a 43 year old male who presents with left shoulder pain and limited mobility.  Left shoulder pain began in August following an incident with a retractable dog leash. The leash caught on his fingers, and when the dog ran to the end of the leash, it jerked his arm, causing a popping sound and immediate burning pain radiating down the back of his arm into the elbow.  Since the incident, he has experienced significant limitations in shoulder mobility, unable to raise his arm above a certain level. Initially, he had to keep his arm in a fixed position until February, unable to perform daily activities with it.  Hot water in the shower helps loosen the shoulder, allowing him to let it dangle and move it slightly. Despite some improvement in movement, he still experiences pulling sensations and significant weakness, unable to lift a milk jug without pain.  Recent activities, such as gardening, have aggravated the shoulder, causing it to throb and burn. He has not been able to regain full strength or function in the shoulder since the injury.        ROS  Constitutional: Denies any fever or chills Gastrointestinal: No reported hemesis, hematochezia, vomiting, or acute GI distress Musculoskeletal: Left shoulder pain and limited range of motion Neurological: No reported episodes of acute onset apraxia, aphasia, dysarthria, agnosia, amnesia, paralysis, loss of coordination, or loss of consciousness  Medication Review  EPINEPHrine, alfuzosin , azelastine, diclofenac , escitalopram , fluticasone, gabapentin , glycopyrrolate, hydrOXYzine , mirtazapine , and montelukast  History Review  Allergy: Mr. Lawrence Pham is allergic to nucynta [tapentadol] and tramadol. Drug: Mr. Lawrence Pham  reports that he does not currently use drugs. Alcohol:  reports no history of alcohol use. Tobacco:  reports that he has never smoked. He has quit using  smokeless tobacco.  His smokeless tobacco use included snuff. Social: Mr. Lawrence Pham  reports that he has never smoked. He has quit using smokeless tobacco.  His smokeless tobacco use included snuff. He reports that he does not currently use drugs. He reports that he does not drink alcohol. Medical:  has a past medical history of Allergy, Asthma, and Chronic pain. Surgical: Mr. Lawrence Pham  has a past surgical history that includes Mandible surgery (Left, 2005). Family: family history includes Drug abuse in his father.  Laboratory Chemistry Profile   Renal Lab Results  Component Value Date   BUN 13 03/26/2022   CREATININE 0.88 03/26/2022   GFRNONAA >60 03/26/2022    Hepatic Lab Results  Component Value Date   AST 16 03/26/2022   ALT 15 03/26/2022   ALBUMIN 4.3 03/26/2022   ALKPHOS 53 03/26/2022    Electrolytes Lab Results  Component Value Date   NA 139 03/26/2022   K 4.1 03/26/2022   CL 106 03/26/2022   CALCIUM 9.6 03/26/2022    Bone No results found for: "VD25OH", "VD125OH2TOT", "ZO1096EA5", "WU9811BJ4", "25OHVITD1", "25OHVITD2", "25OHVITD3", "TESTOFREE", "TESTOSTERONE"  Inflammation (CRP: Acute Phase) (ESR: Chronic Phase) No results found for: "CRP", "ESRSEDRATE", "LATICACIDVEN"       Note: Above Lab results reviewed.   Physical Exam  General appearance: Well nourished, well developed, and well hydrated. In no apparent acute distress Mental status: Alert, oriented x 3 (person, place, & time)       Respiratory: No evidence of acute respiratory distress Eyes: PERLA Vitals: BP 107/62 (Patient Position: Sitting, Cuff Size: Normal)   Pulse 82   Temp 98.4 F (36.9 C) (Temporal)   Resp 16   Ht 6' (1.829 m)   Wt 140 lb (63.5 kg)   SpO2 99%   BMI 18.99 kg/m  BMI: Estimated body mass index is 18.99 kg/m as calculated from the following:   Height as of this encounter: 6' (1.829 m).   Weight as of this encounter: 140 lb (63.5 kg). Ideal: Ideal body weight: 77.6 kg (171 lb 1.2  oz)  Left shoulder pain and limited range of motion  Assessment   Diagnosis  1. Chronic left shoulder pain   2. Disorder of left rotator cuff   3. Osteoarthritis of left glenohumeral joint   4. Bilateral primary osteoarthritis of knee   5. Piriformis muscle pain   6. Bilateral hip pain   7. Current moderate episode of major depressive disorder, unspecified whether recurrent (HCC)   8. Chronic pain syndrome      Updated Problems: Problem  Chronic Left Shoulder Pain  Disorder of Left Rotator Cuff  Osteoarthritis of Left Glenohumeral Joint    Plan of Care  Problem-specific:  Assessment and Plan    Left shoulder pain and weakness   Chronic left shoulder pain and weakness have persisted since August after a traumatic incident with a retractable leash. The pain is burning and throbbing, radiating down the back of the arm into the elbow, with limited range of motion and significant weakness, making it difficult to lift a milk jug. Although there is some improvement in mobility, pain and weakness remain. Differential diagnosis includes possible dislocation or other structural injury. He declined a shoulder injection, preferring to explore physical therapy. Order an x-ray of the left shoulder and refer to physical therapy for rehabilitation.  Consider a follow-up CT or MRI if the x-ray indicates dislocation or other structural issues.       Mr. Lawrence Pham has a current medication list which includes the following long-term medication(s): azelastine, escitalopram , fluticasone, glycopyrrolate, mirtazapine , montelukast, and gabapentin .  Pharmacotherapy (Medications Ordered): Meds ordered this encounter  Medications   gabapentin  (NEURONTIN ) 300 MG capsule    Sig: Take 1 capsule (300 mg total) by mouth 3 (three) times daily.    Dispense:  90 capsule    Refill:  5   diclofenac  (VOLTAREN ) 75 MG EC tablet    Sig: Take 1 tablet (75 mg total) by mouth 2 (two) times daily as needed for  moderate pain (pain score 4-6) (knee pain).    Dispense:  60 tablet    Refill:  5   Orders:  Orders Placed This Encounter  Procedures   DG Shoulder Left    Please make sure that the patient understands that this needs to be done as soon as possible. Never have the patient do the imaging "just before the next appointment". Inform patient that having the imaging done within the [facility] will expedite the availability of the results and will provide imaging availability to the requesting physician. In addition inform the patient that the imaging order has an expiration date and will not be renewed if not done within the active period.    Standing Status:   Future    Number of Occurrences:   1    Expiration Date:   07/07/2024    Scheduling Instructions:     Imaging must be done as soon as possible. Inform patient that order will expire within 30 days and I will not renew it.    Reason for Exam (SYMPTOM  OR DIAGNOSIS REQUIRED):   Left shoulder pain    Preferred imaging location?:   [facility]    Call Results- Best Contact Number?:   [phone] [facility]    Release to patient:   Immediate   Ambulatory referral to Physical Therapy    Referral Priority:   Routine    Referral Type:   Physical Medicine    Referral Reason:   Specialty Services Required    Requested Specialty:   Physical Therapy    Number of Visits Requested:   1   Follow-up plan:   Return for PRN.      Recent Visits No visits were found meeting these conditions. Showing recent visits within past 90 days and meeting all other requirements Today's Visits Date Type Provider Dept  04/06/24 Office Visit Cephus Collin, MD [facility]  Showing today's visits and meeting all other requirements Future Appointments Date Type Provider Dept  04/22/24 Appointment Cephus Collin, MD [facility]  Showing future appointments within next 90 days and  meeting all other requirements   I discussed the assessment and treatment plan with the patient. The patient was provided an opportunity to ask questions and all were answered. The patient agreed with the plan and demonstrated an understanding of the instructions.  Patient advised to call back or seek an in-person evaluation if the symptoms or condition worsens.  Duration of encounter: .  Total time on encounter, as per AMA guidelines included both the face-to-face and non-face-to-face time personally spent by the physician and/or other qualified health care professional(s) on the day of the encounter (includes time in activities that require the physician or other qualified health care professional and does  not include time in activities normally performed by clinical staff). Physician's time may include the following activities when performed: Preparing to see the patient (e.g., pre-charting review of records, searching for previously ordered imaging, lab work, and nerve conduction tests) Review of prior analgesic pharmacotherapies. Reviewing PMP Interpreting ordered tests (e.g., lab work, imaging, nerve conduction tests) Performing post-procedure evaluations, including interpretation of diagnostic procedures Obtaining and/or reviewing separately obtained history Performing a medically appropriate examination and/or evaluation Counseling and educating the patient/family/caregiver Ordering medications, tests, or procedures Referring and communicating with other health care professionals (when not separately reported) Documenting clinical information in the electronic or other health record Independently interpreting results (not separately reported) and communicating results to the patient/ family/caregiver Care coordination (not separately reported)  Note by: Cephus Collin, MD (TTS and AI technology used. I apologize for any typographical errors that were not detected and  corrected.) Date: 04/06/2024; Time: 11:07 AM

## 2024-04-06 NOTE — Progress Notes (Signed)
Nursing Pain Medication Assessment:  Safety precautions to be maintained throughout the outpatient stay will include: orient to surroundings, keep bed in low position, maintain call bell within reach at all times, provide assistance with transfer out of bed and ambulation.

## 2024-04-07 ENCOUNTER — Ambulatory Visit (INDEPENDENT_AMBULATORY_CARE_PROVIDER_SITE_OTHER): Admitting: Professional Counselor

## 2024-04-07 DIAGNOSIS — F411 Generalized anxiety disorder: Secondary | ICD-10-CM

## 2024-04-07 DIAGNOSIS — F330 Major depressive disorder, recurrent, mild: Secondary | ICD-10-CM | POA: Diagnosis not present

## 2024-04-07 NOTE — Progress Notes (Signed)
 THERAPIST PROGRESS NOTE  Virtual Visit via Video Note  I connected with Lawrence Pham on 04/07/24 at  1:00 PM EDT by a video enabled telemedicine application and verified that I am speaking with the correct person using two identifiers.  Location: Patient: Home Provider: Office   I discussed the limitations of evaluation and management by telemedicine and the availability of in person appointments. The patient expressed understanding and agreed to proceed.   I discussed the assessment and treatment plan with the patient. The patient was provided an opportunity to ask questions and all were answered. The patient agreed with the plan and demonstrated an understanding of the instructions.   The patient was advised to call back or seek an in-person evaluation if the symptoms worsen or if the condition fails to improve as anticipated.  I provided 50 minutes of non-face-to-face time during this encounter. Len Quale, [facility]  Session Time: 1:02 PM - 1:52 PM   Participation Level: Active  Behavioral Response: Casual, Alert, Euthymic  Type of Therapy: Individual Therapy  Treatment Goals addressed: Active Anxiety  LTG: "My main thing that gets me down is my health. Not being able to do things that a normal 43 y.o. would be able to do. Not being able to work a full-time job. That's my main thing that my whole depression sits on."                Start:  02/25/24    Expected End:  02/23/25      STG: "That's the only time that my mind's not racing (when playing music)." To improve thinking patterns AEB using mindfulness skills 3 out of 7 days a week for the next 12 weeks.     STG: "The light switches have to be the same, when I tie my shoes, I make sure the bows have to be the same." To reduce OCD behaviors AEB engaging in exposure response prevention exercises to reduce behaviors over the next 12 weeks.     STG: "I'd like to get back to at least 165 or 170. I think that Lawrence  medicine has helped my appetite." To improve wellness AEB creating a daily routine to include nutrition, exercise, and sleep over the next 12 weeks.    ProgressTowards Goals: Progressing  Interventions: CBT  Summary: Lawrence Pham is a 43 y.o. male who presents with a history of anxiety and depression. He appeared alert and oriented x5. He stated things have been busy, continuing to attend his daughter's sports games. Overall, he reported things are going well though. Lawrence Pham engaged in writing exercise. He was receptive to types of cognitive distortions and cognitive model. He was able to restructure his negative thinking. Lawrence Pham discussed ongoing issues with perfectionism and noted how it even impacts his relationships. He was in agreement to discuss communication skills next session.  Therapist Response: Conducted session with Lawrence Pham. Began session with check-in/update since previous session. Utilized empathetic and reflective listening. Engaged Lawrence Pham in writing exercise, "Cracking the NUTS and eliminating the ANTS." Provided psychoeducation on cognitive model and cognitive distortions. Emailed handout to Lawrence Pham so he can continue to build his awareness around thinking patterns. Scheduled additional appointment and concluded session.   Suicidal/Homicidal: No  Plan: Return again in 2 weeks.  Diagnosis: GAD (generalized anxiety disorder)  MDD (major depressive disorder), recurrent episode, mild (HCC)  Collaboration of Care: Medication Management AEB chart review  Patient/Guardian was advised Release of Information must be obtained prior to any record release  in order to collaborate their care with an outside provider. Patient/Guardian was advised if they have not already done so to contact the registration department to sign all necessary forms in order for us  to release information regarding their care.   Consent: Patient/Guardian gives verbal consent for treatment and assignment of benefits for  services provided during this visit. Patient/Guardian expressed understanding and agreed to proceed.   Len Quale, [facility] 04/07/2024

## 2024-04-20 ENCOUNTER — Ambulatory Visit (INDEPENDENT_AMBULATORY_CARE_PROVIDER_SITE_OTHER): Admitting: Professional Counselor

## 2024-04-20 DIAGNOSIS — F411 Generalized anxiety disorder: Secondary | ICD-10-CM | POA: Diagnosis not present

## 2024-04-20 DIAGNOSIS — F330 Major depressive disorder, recurrent, mild: Secondary | ICD-10-CM

## 2024-04-20 NOTE — Progress Notes (Signed)
 THERAPIST PROGRESS NOTE  Virtual Visit via Video Note  I connected with Lawrence Pham on 04/20/24 at 11:00 AM EDT by a video enabled telemedicine application and verified that I am speaking with the correct person using two identifiers.  Location: Patient: Home Provider: Office   I discussed the limitations of evaluation and management by telemedicine and the availability of in person appointments. The patient expressed understanding and agreed to proceed.   I discussed the assessment and treatment plan with the patient. The patient was provided an opportunity to ask questions and all were answered. The patient agreed with the plan and demonstrated an understanding of the instructions.   The patient was advised to call back or seek an in-person evaluation if the symptoms worsen or if the condition fails to improve as anticipated.  I provided 41 minutes of non-face-to-face time during this encounter. Len Quale, [facility]   Session Time: 11:01 AM - 11:42 AM  Participation Level: Active  Behavioral Response: Casual, Alert, Anxious and Dysphoric  Type of Therapy: Individual Therapy  Treatment Goals addressed: Active Anxiety  LTG: "My main thing that gets me down is my health. Not being able to do things that a normal 44 y.o. would be able to do. Not being able to work a full-time job. That's my main thing that my whole depression sits on."                Start:  02/25/24    Expected End:  02/23/25      STG: "That's the only time that my mind's not racing (when playing music)." To improve thinking patterns AEB using mindfulness skills 3 out of 7 days a week for the next 12 weeks.     STG: "The light switches have to be the same, when I tie my shoes, I make sure the bows have to be the same." To reduce OCD behaviors AEB engaging in exposure response prevention exercises to reduce behaviors over the next 12 weeks.     STG: "I'd like to get back to at least 165 or 170. I think  that new medicine has helped my appetite." To improve wellness AEB creating a daily routine to include nutrition, exercise, and sleep over the next 12 weeks.    ProgressTowards Goals: Progressing  Interventions: CBT, Motivational Interviewing, Supportive, and Meditation: chronic pain  Summary: Lawrence Pham is a 43 y.o. male who presents with a history of anxiety and depression. He appeared alert and oriented x5. He stated yesterday was a really bad day. Zamir reported he woke up in pain and the rest of the day was feeling irritable and overwhelmed. He appeared receptive to coping skills and habit stacking. Antwain actively listened to [facility] model. He reported sometimes he is able to take that pause before reacting but not always. He noted this has gotten better since getting on medication and engaging in therapy though. He seemed ambivalent about meditations, as he reported his mind is always racing, but he seemed willing to try.   Therapist Response: Conducted session with Lawrence Pham. Began session with check-in/update since previous session. Utilized empathetic and reflective listening. Reminded Derrious of basic coping skills and need to practice consistently. Explained habit stacking to help build routine. Shared ABC model to help identify thinking and how it contributes to events and emotions. Encouraged Lawrence Pham to practice this after struggling with negative emotions. Shared guided meditation for chronic pain and also encouraged use of these to help manage chronic pain issues.  Confirmed next appointment and concluded session.   Suicidal/Homicidal: No  Plan: Return again in 5 weeks.  Diagnosis: GAD (generalized anxiety disorder)  MDD (major depressive disorder), recurrent episode, mild (HCC)  Collaboration of Care: Medication Management AEB chart review  Patient/Guardian was advised Release of Information must be obtained prior to any record release in order to collaborate their care with an  outside provider. Patient/Guardian was advised if they have not already done so to contact the registration department to sign all necessary forms in order for us  to release information regarding their care.   Consent: Patient/Guardian gives verbal consent for treatment and assignment of benefits for services provided during this visit. Patient/Guardian expressed understanding and agreed to proceed.   Len Quale, [facility] 04/20/2024

## 2024-04-22 ENCOUNTER — Ambulatory Visit
Attending: Student in an Organized Health Care Education/Training Program | Admitting: Student in an Organized Health Care Education/Training Program

## 2024-04-22 DIAGNOSIS — G894 Chronic pain syndrome: Secondary | ICD-10-CM | POA: Diagnosis not present

## 2024-04-22 DIAGNOSIS — G8929 Other chronic pain: Secondary | ICD-10-CM

## 2024-04-22 DIAGNOSIS — M170 Bilateral primary osteoarthritis of knee: Secondary | ICD-10-CM

## 2024-04-22 DIAGNOSIS — M19012 Primary osteoarthritis, left shoulder: Secondary | ICD-10-CM | POA: Diagnosis not present

## 2024-04-22 DIAGNOSIS — M25512 Pain in left shoulder: Secondary | ICD-10-CM | POA: Diagnosis not present

## 2024-04-22 DIAGNOSIS — M67912 Unspecified disorder of synovium and tendon, left shoulder: Secondary | ICD-10-CM | POA: Diagnosis not present

## 2024-04-22 NOTE — Progress Notes (Signed)
 PROVIDER NOTE: Interpretation of information contained herein should be left to medically-trained personnel. Specific patient instructions are provided elsewhere under "Patient Instructions" section of medical record. This document was created in part using AI and STT-dictation technology, any transcriptional errors that may result from this process are unintentional.  Patient: Lawrence Pham  Service: E/M   PCP: Pcp, No  DOB: 1981/04/24  DOS: 04/22/2024  Provider: Cephus Collin, MD  MRN: [id number]  Delivery: Virtual Visit  Specialty: Interventional Pain Management  Type: Established Patient  Setting: Ambulatory outpatient facility  Specialty designation: 09  Referring Prov.: No ref. provider found  Location: Remote location       Virtual Encounter - Pain Management PROVIDER NOTE: Information contained herein reflects review and annotations entered in association with encounter. Interpretation of such information and data should be left to medically-trained personnel. Information provided to patient can be located elsewhere in the medical record under "Patient Instructions". Document created using STT-dictation technology, any transcriptional errors that may result from process are unintentional.    Contact & Pharmacy Preferred: [phone] Home: [phone] (home) Mobile: [phone] (mobile) E-mail: [email or web address]  [id number] - [place], [place] - [address] [place] [address] Phone: [phone] Fax: [phone]   Pre-screening  Mr. Laban offered "in-person" vs "virtual" encounter. He indicated preferring virtual for this encounter.   Reason COVID-19*  Social distancing based on CDC and AMA recommendations.   I contacted Lawrence Pham on 04/22/2024 via telephone.      I clearly identified myself as Cephus Collin, MD. I verified that I was speaking with the correct person using two identifiers (Name: RAYHAAN HUSTER, and date of birth:  1981-03-23).  Consent I sought verbal advanced consent from Lawrence Pham for virtual visit interactions. I informed Mr. Noone of possible security and privacy concerns, risks, and limitations associated with providing "not-in-person" medical evaluation and management services. I also informed Mr. Roulston of the availability of "in-person" appointments. Finally, I informed him that there would be a charge for the virtual visit and that he could be  personally, fully or partially, financially responsible for it. Mr. Mayberry expressed understanding and agreed to proceed.   Historic Elements   Mr. HAYDIN CALANDRA is a 43 y.o. year old, male patient evaluated today after our last contact on 04/06/2024. Mr. Ostrand  has a past medical history of Allergy, Asthma, and Chronic pain. He also  has a past surgical history that includes Mandible surgery (Left, 2005). Mr. Lingard has a current medication list which includes the following prescription(s): alfuzosin , azelastine, diclofenac , epinephrine, escitalopram , fluticasone, gabapentin , glycopyrrolate, hydroxyzine , mirtazapine , and montelukast. He  reports that he has never smoked. He has quit using smokeless tobacco.  His smokeless tobacco use included snuff. He reports that he does not currently use drugs. He reports that he does not drink alcohol. Mr. Convey is allergic to nucynta [tapentadol] and tramadol.  BMI: Estimated body mass index is 18.99 kg/m as calculated from the following:   Height as of 04/06/24: 6' (1.829 m).   Weight as of 04/06/24: 140 lb (63.5 kg). Last encounter: 04/06/2024. Last procedure: Visit date not found.  HPI  Today, he is being contacted for review of shoulder x-ray  Laboratory Chemistry Profile   Renal Lab Results  Component Value Date   BUN 13 03/26/2022   CREATININE 0.88 03/26/2022   GFRNONAA >60 03/26/2022    Hepatic Lab Results  Component Value Date   AST 16 03/26/2022  ALT 15 03/26/2022   ALBUMIN 4.3 03/26/2022    ALKPHOS 53 03/26/2022    Electrolytes Lab Results  Component Value Date   NA 139 03/26/2022   K 4.1 03/26/2022   CL 106 03/26/2022   CALCIUM 9.6 03/26/2022    Bone No results found for: "VD25OH", "VD125OH2TOT", "RU0454UJ8", "JX9147WG9", "25OHVITD1", "25OHVITD2", "25OHVITD3", "TESTOFREE", "TESTOSTERONE"  Inflammation (CRP: Acute Phase) (ESR: Chronic Phase) No results found for: "CRP", "ESRSEDRATE", "LATICACIDVEN"       Note: Above Lab results reviewed.  Imaging   DG Shoulder Left CLINICAL DATA:  Chronic pain.  EXAM: LEFT SHOULDER - 3 VIEW  COMPARISON:  None Available.  FINDINGS: There is no evidence of fracture or dislocation. There is no evidence of arthropathy or other focal bone abnormality. Soft tissues are unremarkable.  IMPRESSION: Negative.  Electronically Signed   By: Sydell Eva M.D.   On: 04/08/2024 22:08  Assessment  The primary encounter diagnosis was Chronic left shoulder pain. Diagnoses of Disorder of left rotator cuff, Osteoarthritis of left glenohumeral joint, Chronic pain syndrome, and Bilateral primary osteoarthritis of knee were also pertinent to this visit.  Plan of Care  Patient continues to have fairly severe left shoulder pain.  I recommend physical therapy and advanced imaging via a left shoulder MRI to evaluate for any rotator cuff arthropathy, tears, tendinopathy given his persistent and worsening pain.  Orders:  Orders Placed This Encounter  Procedures   MR SHOULDER LEFT WO CONTRAST    Standing Status:   Future    Expiration Date:   07/23/2024    Scheduling Instructions:     Please make sure that the patient understands that this needs to be done as soon as possible. Never have the patient do the imaging "just before the next appointment". Inform patient that having the imaging done within the [facility] will expedite the availability of the results and will provide      imaging availability to the requesting physician. In addition  inform the patient that the imaging order has an expiration date and will not be renewed if not done within the active period.    What is the patient's sedation requirement?:   No Sedation    Does the patient have a pacemaker or implanted devices?:   No    Preferred imaging location?:   [facility] (table limit-350lbs)    Call Results- Best Contact Number?:   [phone] [facility]    Radiology Contrast Protocol - do NOT remove file path:   \\[email or web address].PDF   Ambulatory referral to Physical Therapy    Referral Priority:   Routine    Referral Type:   Physical Medicine    Referral Reason:   Specialty Services Required    Requested Specialty:   Physical Therapy    Number of Visits Requested:   1   Follow-up plan:   Return for patient will call to schedule F2F appt prn.      Status post right sacroiliac joint injection, right piriformis injection Helped significantly, repeat as needed.  09/18/20:  bilateral knee intra-articular steroid: Not helpful.  Bilateral genicular nerve block 11/20/2020.  Consider repeating SI joint/piriformis injection every 2 to 3 months. Left GN RFA 08/01/21, right genicular RFA 09/26/2021           Recent Visits Date Type Provider Dept  04/06/24 Office Visit Cephus Collin, MD [facility]  Showing recent visits within past 90 days and meeting all other requirements Today's  Visits Date Type Provider Dept  04/22/24 Office Visit Cephus Collin, MD [facility]  Showing today's visits and meeting all other requirements Future Appointments No visits were found meeting these conditions. Showing future appointments within next 90 days and meeting all other requirements  I discussed the assessment and treatment plan with the patient. The patient was provided an opportunity to ask questions and all were answered. The patient agreed with the plan and demonstrated an  understanding of the instructions.  Patient advised to call back or seek an in-person evaluation if the symptoms or condition worsens.  Duration of encounter: .  Note by: Cephus Collin, MD Date: 04/22/2024; Time: 1:34 PM

## 2024-05-25 ENCOUNTER — Ambulatory Visit (INDEPENDENT_AMBULATORY_CARE_PROVIDER_SITE_OTHER): Admitting: Psychiatry

## 2024-05-25 ENCOUNTER — Other Ambulatory Visit: Payer: Self-pay

## 2024-05-25 ENCOUNTER — Encounter: Payer: Self-pay | Admitting: Psychiatry

## 2024-05-25 VITALS — BP 100/70 | HR 90 | Temp 98.2°F | Ht 72.0 in | Wt 141.6 lb

## 2024-05-25 DIAGNOSIS — F3342 Major depressive disorder, recurrent, in full remission: Secondary | ICD-10-CM | POA: Diagnosis not present

## 2024-05-25 DIAGNOSIS — F1290 Cannabis use, unspecified, uncomplicated: Secondary | ICD-10-CM | POA: Diagnosis not present

## 2024-05-25 DIAGNOSIS — F411 Generalized anxiety disorder: Secondary | ICD-10-CM

## 2024-05-25 DIAGNOSIS — F1111 Opioid abuse, in remission: Secondary | ICD-10-CM

## 2024-05-25 DIAGNOSIS — G4701 Insomnia due to medical condition: Secondary | ICD-10-CM | POA: Diagnosis not present

## 2024-05-25 DIAGNOSIS — F1311 Sedative, hypnotic or anxiolytic abuse, in remission: Secondary | ICD-10-CM

## 2024-05-25 NOTE — Progress Notes (Unsigned)
 [facility] MD OP Progress Note  05/25/2024 10:53 AM [facility]  MRN:  [id number]  Chief Complaint:  Chief Complaint  Patient presents with   Follow-up   Anxiety   Depression   Insomnia   Medication Refill   Discussed the use of AI scribe software for clinical note transcription with the patient, who gave verbal consent to proceed.  History of Present Illness Lawrence Pham is a 43 year old Caucasian male, married, disabled, lives in [place], has a history of MDD, GAD, insomnia, history of unintentional weight loss, long-term use of cannabis, chronic pain was evaluated in office today for a follow-up appointment.  His depression symptoms have improved significantly with the use of escitalopram  and mirtazapine . He states, 'I haven't been depressed at all.' He takes escitalopram  daily in the morning and mirtazapine  at night. He also takes gabapentin  at night, which makes him sleepy, so he avoids taking it during the day.  He experiences back pain and shoulder pain. He manages the pain by going to pain management, as well as taking medications like gabapentin  and also uses cannabis. He has been doing self-directed exercises, which have helped to some extent.  He wakes up early in the morning due to nasal congestion, which he attributes to allergies. He uses saline mist and takes Allegra, a 24-hour allergy medication, which he has recently started taking at night instead of during the day. This change seems to be more effective for him.  He has been feeling anxious over the past few days, particularly in crowded places. He attributes some of this anxiety to concerns about his father's health, who is 67 years old and dealing with multiple health issues. He uses music, particularly playing the piano and guitar, as a coping mechanism for his anxiety. He also takes hydroxyzine  as needed, usually at night, but occasionally during the day if necessary. He notes a 'weird feeling' when taking it during the  day, which he does not experience at night.  He reports a recent weight gain of 5 to 6 pounds, which he attributes to a good appetite. He has had to adjust his belt size accordingly.  He currently denies any suicidality, homicidality or perceptual disturbances.    Visit Diagnosis:    ICD-10-CM   1. Recurrent major depressive disorder, in full remission (HCC)  F33.42     2. GAD (generalized anxiety disorder)  F41.1     3. Insomnia due to medical condition  G47.01    Pain, anxiety    4. Long term current use of cannabis  F12.90     5. History of opioid abuse (HCC)  F11.11     6. Benzodiazepine abuse in remission [facility])  F13.11       Past Psychiatric History: I have reviewed past psychiatric history from progress note on 01/11/2022.  Past trials of medications like Effexor, Valium.  Past Medical History:  Past Medical History:  Diagnosis Date   Allergy    Asthma    Chronic pain     Past Surgical History:  Procedure Laterality Date   MANDIBLE SURGERY Left 2005    Family Psychiatric History: I have reviewed family psychiatric history from progress note on 01/11/2022.  Family History:  Family History  Problem Relation Age of Onset   Drug abuse Father     Social History: I have reviewed social history from progress note on 01/11/2022. Social History   Socioeconomic History   Marital status: Married    Spouse name: Not  on file   Number of children: 2   Years of education: 12   Highest education level: Some college, no degree  Occupational History   Not on file  Tobacco Use   Smoking status: Never   Smokeless tobacco: Former    Types: Snuff  Vaping Use   Vaping status: Every Day   Substances: THC, Mixture of cannabinoids  Substance and Sexual Activity   Alcohol use: No   Drug use: Not Currently   Sexual activity: Not on file  Other Topics Concern   Not on file  Social History Narrative   Not on file   Social Drivers of Health   Financial Resource Strain:  Low Risk  (01/23/2024)   Overall Financial Resource Strain (CARDIA)    Difficulty of Paying Living Expenses: Not hard at all  Food Insecurity: No Food Insecurity (01/23/2024)   Hunger Vital Sign    Worried About Running Out of Food in the Last Year: Never true    Ran Out of Food in the Last Year: Never true  Transportation Needs: Unmet Transportation Needs (01/23/2024)   PRAPARE - Transportation    Lack of Transportation (Medical): No    Lack of Transportation (Non-Medical): Yes  Physical Activity: Insufficiently Active (01/23/2024)   Exercise Vital Sign    Days of Exercise per Week: 7 days    Minutes of Exercise per Session: 10 min  Stress: Stress Concern Present (01/23/2024)   [employer] of Occupational Health - Occupational Stress Questionnaire    Feeling of Stress : Very much  Social Connections: Moderately Integrated (01/23/2024)   Social Connection and Isolation Panel    Frequency of Communication with Friends and Family: More than three times a week    Frequency of Social Gatherings with Friends and Family: Never    Attends Religious Services: More than 4 times per year    Active Member of [employer] or Organizations: No    Attends Banker Meetings: Never    Marital Status: Married    Allergies:  Allergies  Allergen Reactions   Nucynta [Tapentadol] Nausea And Vomiting   Tramadol Rash    Metabolic Disorder Labs: No results found for: HGBA1C, MPG No results found for: PROLACTIN No results found for: CHOL, TRIG, HDL, CHOLHDL, VLDL, LDLCALC Lab Results  Component Value Date   TSH 0.798 03/26/2022    Therapeutic Level Labs: No results found for: LITHIUM No results found for: VALPROATE No results found for: CBMZ  Current Medications: Current Outpatient Medications  Medication Sig Dispense Refill   alfuzosin  (UROXATRAL ) 10 MG 24 hr tablet Take 1 tablet (10 mg total) by mouth daily. 90 tablet 3   azelastine (ASTELIN) 0.1 % nasal spray  Place into both nostrils.     diclofenac  (VOLTAREN ) 75 MG EC tablet Take 1 tablet (75 mg total) by mouth 2 (two) times daily as needed for moderate pain (pain score 4-6) (knee pain). 60 tablet 5   EPINEPHrine 0.3 mg/0.3 mL IJ SOAJ injection See admin instructions.     escitalopram  (LEXAPRO ) 5 MG tablet TAKE 1 TABLET BY MOUTH EVERY DAY WITH BREAKFAST 90 tablet 1   fluticasone (FLONASE) 50 MCG/ACT nasal spray Place into both nostrils.     gabapentin  (NEURONTIN ) 300 MG capsule Take 1 capsule (300 mg total) by mouth 3 (three) times daily. 90 capsule 5   glycopyrrolate (ROBINUL) 1 MG tablet Take 2 mg by mouth daily.     hydrOXYzine  (VISTARIL ) 25 MG capsule TAKE 1 CAPSULE BY MOUTH 3  TIMES DAILY AS NEEDED FOR ANXIETY. 270 capsule 1   mirtazapine  (REMERON ) 45 MG tablet Take 1 tablet (45 mg total) by mouth at bedtime. 90 tablet 1   montelukast (SINGULAIR) 10 MG tablet Take 10 mg by mouth at bedtime.     No current facility-administered medications for this visit.     Musculoskeletal: Strength & Muscle Tone: within normal limits Gait & Station: walks with cane Patient leans: N/A  Psychiatric Specialty Exam: Review of Systems  Psychiatric/Behavioral:  Positive for sleep disturbance. The patient is nervous/anxious.     Blood pressure 100/70, pulse 90, temperature 98.2 F (36.8 C), temperature source Temporal, height 6' (1.829 m), weight 141 lb 9.6 oz (64.2 kg).Body mass index is 19.2 kg/m.  General Appearance: Casual  Eye Contact:  Fair  Speech:  Normal Rate  Volume:  Normal  Mood:  Anxious  Affect:  Congruent  Thought Process:  Goal Directed and Descriptions of Associations: Intact  Orientation:  Full (Time, Place, and Person)  Thought Content: Logical   Suicidal Thoughts:  No  Homicidal Thoughts:  No  Memory:  Immediate;   Fair Recent;   Fair Remote;   Fair  Judgement:  Fair  Insight:  Fair  Psychomotor Activity:  Normal  Concentration:  Concentration: Fair and Attention Span: Fair   Recall:  [employer] of Knowledge: Fair  Language: Fair  Akathisia:  No  Handed:  Left  AIMS (if indicated): not done  Assets:  Communication Skills Desire for Improvement Housing Social Support Transportation  ADL's:  Intact  Cognition: WNL  Sleep:  improving   Screenings: Geneticist, molecular Office Visit from 03/18/2022 in [facility]  AIMS Total Score 0   GAD-7    Flowsheet Row Office Visit from 01/22/2024 in [facility] Counselor from 12/09/2022 in [facility] Video Visit from 11/28/2022 in [facility] Office Visit from 09/25/2022 in [facility] Video Visit from 07/25/2022 in [facility]  Total GAD-7 Score 8 14 10 5 12    PHQ2-9    Flowsheet Row Office Visit from 05/25/2024 in [facility] Office Visit from 04/06/2024 in [facility] Visit from 02/26/2024 in [facility] Office Visit from 01/22/2024 in [facility] Video Visit from 07/01/2023 in [facility]  PHQ-2 Total Score 1 0 0 1 1  PHQ-9 Total Score -- -- 1 12 --   Flowsheet Row Office Visit from 05/25/2024 in [facility] Office Visit from 02/26/2024 in [facility] Office Visit from 01/22/2024 in [facility]  C-SSRS RISK CATEGORY No Risk No Risk No Risk     Assessment and Plan: Lawrence Pham is a 43 year old Caucasian male who has a history of depression, anxiety, insomnia was evaluated in office today.  Discussed assessment and plan as noted  below.  Major depressive disorder in remission Currently denies any significant depression symptoms, well-managed on the current medication regimen. Continue Lexapro  5 mg daily. Continue Mirtazapine  45 mg at bedtime Continue psychotherapy sessions with Ms. Almarie Ligas  Generalized anxiety disorder/Panic attacks-improving Currently reports anxiety symptoms since the past few days mostly related to his dad's health issues.  Has upcoming appointment with therapist  and is motivated to stay in therapy as well as make use of coping strategies. Continue Lexapro  and mirtazapine  as prescribed Continue Hydroxyzine  25 mg 3 times a day as needed Continue psychotherapy sessions with Ms. Veva  Long-term use of cannabis-unstable Patient is not interested in cutting back. Will reevaluate in future sessions.  Follow-up Follow-up in clinic in 3 months or sooner if needed.   Collaboration of Care: Collaboration of Care: Referral or follow-up with counselor/therapist AEB encouraged to follow up with therapist , Ms.Gainey  Patient/Guardian was advised Release of Information must be obtained prior to any record release in order to collaborate their care with an outside provider. Patient/Guardian was advised if they have not already done so to contact the registration department to sign all necessary forms in order for us  to release information regarding their care.   Consent: Patient/Guardian gives verbal consent for treatment and assignment of benefits for services provided during this visit. Patient/Guardian expressed understanding and agreed to proceed.   This note was generated in part or whole with voice recognition software. Voice recognition is usually quite accurate but there are transcription errors that can and very often do occur. I apologize for any typographical errors that were not detected and corrected.    Kelbie Moro, MD 05/26/2024, 8:13 AM

## 2024-05-27 ENCOUNTER — Ambulatory Visit: Admitting: Professional Counselor

## 2024-05-27 DIAGNOSIS — F330 Major depressive disorder, recurrent, mild: Secondary | ICD-10-CM | POA: Diagnosis not present

## 2024-05-27 DIAGNOSIS — F411 Generalized anxiety disorder: Secondary | ICD-10-CM

## 2024-05-27 NOTE — Progress Notes (Signed)
 THERAPIST PROGRESS NOTE  Virtual Visit via Video Note  I connected with Lawrence Pham on 05/27/24 at  9:00 AM EDT by a video enabled telemedicine application and verified that I am speaking with the correct person using two identifiers.  Location: Patient: Home Provider: Office   I discussed the limitations of evaluation and management by telemedicine and the availability of in person appointments. The patient expressed understanding and agreed to proceed.   I discussed the assessment and treatment plan with the patient. The patient was provided an opportunity to ask questions and all were answered. The patient agreed with the plan and demonstrated an understanding of the instructions.   The patient was advised to call back or seek an in-person evaluation if the symptoms worsen or if the condition fails to improve as anticipated.  I provided 19 minutes of non-face-to-face time during this encounter. Lawrence Pham, [facility]  Session Time: 9:05 AM - 9:24 AM   Participation Level: Active  Behavioral Response: Well Groomed, Alert, Anxious  Type of Therapy: Individual Therapy  Treatment Goals addressed:  Active Anxiety  LTG: My main thing that gets me down is my health. Not being able to do things that a normal 43 y.o. would be able to do. Not being able to work a full-time job. That's my main thing that my whole depression sits on.                Start:  02/25/24    Expected End:  02/23/25      STG: That's the only time that my mind's not racing (when playing music). To improve thinking patterns AEB using mindfulness skills 3 out of 7 days a week for the next 12 weeks.     STG: The light switches have to be the same, when I tie my shoes, I make sure the bows have to be the same. To reduce OCD behaviors AEB engaging in exposure response prevention exercises to reduce behaviors over the next 12 weeks.     STG: I'd like to get back to at least 165 or 170. I think that new  medicine has helped my appetite. To improve wellness AEB creating a daily routine to include nutrition, exercise, and sleep over the next 12 weeks.   ProgressTowards Goals: Progressing  Interventions: CBT and Supportive  Summary: Lawrence Pham is a 43 y.o. male who presents with a history of anxiety and depression. He appeared alert and oriented x5. He stated things are going well overall. Lawrence Pham reported they are going to the beach this weekend. He noted ways to cope with large groups, including extended family. He reported he tried meditations but feels music is just his best coping mechanism. Lawrence Pham took note of bilateral stimulation music to see if it has additional benefits for him. He continues to struggle with OCD tendencies. He was receptive to exposure hierarchy and received email with handout/explanation to practice. Lawrence Pham had to end session early due to kids having a doctor appointment.  Therapist Response: Conducted session with Lawrence. Began session with check-in/update since previous session. Utilized empathetic and reflective listening. Used open-ended questions to facilitate discussion and summarized thoughts/feelings. Identified plan for coping ahead for beach trip. Shared information about bilateral stimulation music for additional grounding. Explained exposure hierarchy and identified examples of exposure exercises to practice between now and next session. Scheduled additional appointment and concluded session.   Suicidal/Homicidal: No  Plan: Return again in 6 weeks.  Diagnosis: GAD (generalized anxiety disorder)  MDD (major depressive disorder), recurrent episode, mild (HCC)  Collaboration of Care: Medication Management AEB chart review  Patient/Guardian was advised Release of Information must be obtained prior to any record release in order to collaborate their care with an outside provider. Patient/Guardian was advised if they have not already done so to contact  the registration department to sign all necessary forms in order for us  to release information regarding their care.   Consent: Patient/Guardian gives verbal consent for treatment and assignment of benefits for services provided during this visit. Patient/Guardian expressed understanding and agreed to proceed.   Lawrence Pham, [facility] 05/27/2024

## 2024-07-05 ENCOUNTER — Ambulatory Visit: Admitting: Professional Counselor

## 2024-07-23 ENCOUNTER — Other Ambulatory Visit: Payer: Self-pay | Admitting: Psychiatry

## 2024-07-23 DIAGNOSIS — F411 Generalized anxiety disorder: Secondary | ICD-10-CM

## 2024-08-09 ENCOUNTER — Telehealth: Payer: Self-pay

## 2024-08-09 DIAGNOSIS — F411 Generalized anxiety disorder: Secondary | ICD-10-CM

## 2024-08-09 DIAGNOSIS — F331 Major depressive disorder, recurrent, moderate: Secondary | ICD-10-CM

## 2024-08-09 MED ORDER — ESCITALOPRAM OXALATE 5 MG PO TABS: 5.0000 mg | ORAL_TABLET | Freq: Every day | ORAL | 1 refills | Status: DC

## 2024-08-09 NOTE — Telephone Encounter (Signed)
 Received a fax from the patients pharmacy requesting a refill  of escitalopram  (LEXAPRO ) 5 MG tablet    Last visit 05-25-24  Next visit 08-25-24      Preferred Pharmacies   [id number] [place], [place] - [address] Phone: [phone]  Fax: [phone]

## 2024-08-09 NOTE — Telephone Encounter (Signed)
 I have sent Lexapro to pharmacy.

## 2024-08-09 NOTE — Addendum Note (Signed)
 Addended byBETHA COBY HEIGHT on: 08/09/2024 04:25 PM   Modules accepted: Orders

## 2024-08-25 ENCOUNTER — Encounter: Payer: Self-pay | Admitting: Psychiatry

## 2024-08-25 ENCOUNTER — Ambulatory Visit (INDEPENDENT_AMBULATORY_CARE_PROVIDER_SITE_OTHER): Admitting: Psychiatry

## 2024-08-25 ENCOUNTER — Other Ambulatory Visit: Payer: Self-pay

## 2024-08-25 VITALS — BP 106/72 | HR 90 | Temp 97.9°F | Ht 72.0 in | Wt 140.8 lb

## 2024-08-25 DIAGNOSIS — G4701 Insomnia due to medical condition: Secondary | ICD-10-CM

## 2024-08-25 DIAGNOSIS — F1111 Opioid abuse, in remission: Secondary | ICD-10-CM

## 2024-08-25 DIAGNOSIS — F411 Generalized anxiety disorder: Secondary | ICD-10-CM

## 2024-08-25 DIAGNOSIS — F3342 Major depressive disorder, recurrent, in full remission: Secondary | ICD-10-CM

## 2024-08-25 DIAGNOSIS — F1290 Cannabis use, unspecified, uncomplicated: Secondary | ICD-10-CM

## 2024-08-25 DIAGNOSIS — F1311 Sedative, hypnotic or anxiolytic abuse, in remission: Secondary | ICD-10-CM

## 2024-08-25 MED ORDER — MIRTAZAPINE 45 MG PO TABS: 45.0000 mg | ORAL_TABLET | Freq: Every day | ORAL | 3 refills | Status: AC

## 2024-08-25 NOTE — Progress Notes (Signed)
 [facility] MD OP Progress Note  08/25/2024 12:53 PM Lawrence Pham  MRN:  [id number]  Chief Complaint:  Chief Complaint  Patient presents with   Follow-up   Anxiety   Depression   Medication Refill   Discussed the use of AI scribe software for clinical note transcription with the patient, who gave verbal consent to proceed.  History of Present Illness Lawrence Pham is a 43 year old Caucasian male, married, disabled, lives in [place], has a history of MDD, GAD, insomnia, history of unintentional weight loss, long-term use of cannabis, chronic pain was evaluated in office today for a follow-up appointment.  He reports stable mood without depressive symptoms. Ongoing anxiety, particularly related to current stressors involving his father's significant medical issues, including an upcoming high-risk surgery and concerns about his father's ability to recover and receive care at home, continues to affect him. Anticipating stressful events, such as taking his father to medical appointments, increases his nervousness. He describes learning to cope with these stressors and managing his emotions as part of daily life.  He experiences occasional panic attacks, which he associates with difficulty breathing due to severe seasonal allergies. He describes a recent episode where he awoke unable to breathe through his nose, which triggered a panic attack. During this episode, he took hydroxyzine , which helped alleviate his symptoms. He typically takes hydroxyzine  at night and only occasionally during the day if needed, expressing caution about daytime sedation. He also takes gabapentin  and mirtazapine  nightly around 10 PM, followed by hydroxyzine  at approximately 11:30 PM. The combination of these medications helps him sleep, and he feels drowsy within 30 minutes of taking gabapentin  and mirtazapine . He denies any changes to his medication regimen since the last visit and reports having an adequate supply of his  medications.  He describes increased appetite at night, which has led to weight gain. He denies any current or recent thoughts of harming himself or others.    Visit Diagnosis:    ICD-10-CM   1. Recurrent major depressive disorder, in full remission  F33.42     2. GAD (generalized anxiety disorder)  F41.1 mirtazapine  (REMERON ) 45 MG tablet    3. Insomnia due to medical condition  G47.01    Pain, mood symptoms    4. Long term current use of cannabis  F12.90     5. History of opioid abuse (HCC)  F11.11     6. Benzodiazepine abuse in remission [facility])  F13.11       Past Psychiatric History: I have reviewed past psychiatric history from progress note on 01/11/2022.  Past trials of medications like Effexor, Valium  Past Medical History:  Past Medical History:  Diagnosis Date   Allergy    Asthma    Chronic pain     Past Surgical History:  Procedure Laterality Date   MANDIBLE SURGERY Left 2005    Family Psychiatric History: I have reviewed family psychiatric history from progress note on 01/11/2022.  Family History:  Family History  Problem Relation Age of Onset   Drug abuse Father     Social History: I have reviewed social history from progress note on 01/11/2022. Social History   Socioeconomic History   Marital status: Married    Spouse name: Not on file   Number of children: 2   Years of education: 12   Highest education level: Some college, no degree  Occupational History   Not on file  Tobacco Use   Smoking status: Never   Smokeless tobacco: Former  Types: Snuff  Vaping Use   Vaping status: Every Day   Substances: THC, Mixture of cannabinoids  Substance and Sexual Activity   Alcohol use: No   Drug use: Not Currently   Sexual activity: Not on file  Other Topics Concern   Not on file  Social History Narrative   Not on file   Social Drivers of Health   Financial Resource Strain: Low Risk  (01/23/2024)   Overall Financial Resource Strain (CARDIA)     Difficulty of Paying Living Expenses: Not hard at all  Food Insecurity: No Food Insecurity (01/23/2024)   Hunger Vital Sign    Worried About Running Out of Food in the Last Year: Never true    Ran Out of Food in the Last Year: Never true  Transportation Needs: Unmet Transportation Needs (01/23/2024)   PRAPARE - Transportation    Lack of Transportation (Medical): No    Lack of Transportation (Non-Medical): Yes  Physical Activity: Insufficiently Active (01/23/2024)   Exercise Vital Sign    Days of Exercise per Week: 7 days    Minutes of Exercise per Session: 10 min  Stress: Stress Concern Present (01/23/2024)   [employer] of Occupational Health - Occupational Stress Questionnaire    Feeling of Stress : Very much  Social Connections: Moderately Integrated (01/23/2024)   Social Connection and Isolation Panel    Frequency of Communication with Friends and Family: More than three times a week    Frequency of Social Gatherings with Friends and Family: Never    Attends Religious Services: More than 4 times per year    Active Member of [employer] or Organizations: No    Attends Banker Meetings: Never    Marital Status: Married    Allergies:  Allergies  Allergen Reactions   Nucynta [Tapentadol] Nausea And Vomiting   Tramadol Rash    Metabolic Disorder Labs: No results found for: HGBA1C, MPG No results found for: PROLACTIN No results found for: CHOL, TRIG, HDL, CHOLHDL, VLDL, LDLCALC Lab Results  Component Value Date   TSH 0.798 03/26/2022    Therapeutic Level Labs: No results found for: LITHIUM No results found for: VALPROATE No results found for: CBMZ  Current Medications: Current Outpatient Medications  Medication Sig Dispense Refill   alfuzosin  (UROXATRAL ) 10 MG 24 hr tablet Take 1 tablet (10 mg total) by mouth daily. 90 tablet 3   azelastine (ASTELIN) 0.1 % nasal spray Place into both nostrils.     diclofenac  (VOLTAREN ) 75 MG EC tablet Take  1 tablet (75 mg total) by mouth 2 (two) times daily as needed for moderate pain (pain score 4-6) (knee pain). 60 tablet 5   EPINEPHrine 0.3 mg/0.3 mL IJ SOAJ injection See admin instructions.     escitalopram  (LEXAPRO ) 5 MG tablet Take 1 tablet (5 mg total) by mouth daily. 90 tablet 1   fluticasone (FLONASE) 50 MCG/ACT nasal spray Place into both nostrils.     gabapentin  (NEURONTIN ) 300 MG capsule Take 1 capsule (300 mg total) by mouth 3 (three) times daily. 90 capsule 5   glycopyrrolate (ROBINUL) 1 MG tablet Take 2 mg by mouth daily.     hydrOXYzine  (VISTARIL ) 25 MG capsule TAKE 1 CAPSULE BY MOUTH 3 TIMES DAILY AS NEEDED FOR ANXIETY. 270 capsule 1   mirtazapine  (REMERON ) 45 MG tablet Take 1 tablet (45 mg total) by mouth at bedtime. 90 tablet 3   montelukast (SINGULAIR) 10 MG tablet Take 10 mg by mouth at bedtime.  No current facility-administered medications for this visit.     Musculoskeletal: Strength & Muscle Tone: within normal limits Gait & Station: walks with cane Patient leans: N/A  Psychiatric Specialty Exam: Review of Systems  Psychiatric/Behavioral:  Positive for sleep disturbance. The patient is nervous/anxious.     Blood pressure 106/72, pulse 90, temperature 97.9 F (36.6 C), temperature source Temporal, height 6' (1.829 m), weight 140 lb 12.8 oz (63.9 kg).Body mass index is 19.1 kg/m.  General Appearance: Casual  Eye Contact:  Good  Speech:  Clear and Coherent  Volume:  Normal  Mood:  Anxious  Affect:  Appropriate  Thought Process:  Goal Directed and Descriptions of Associations: Intact  Orientation:  Full (Time, Place, and Person)  Thought Content: Logical   Suicidal Thoughts:  No  Homicidal Thoughts:  No  Memory:  Immediate;   Fair Recent;   Fair Remote;   Fair  Judgement:  Fair  Insight:  Fair  Psychomotor Activity:  Normal  Concentration:  Concentration: Fair and Attention Span: Fair  Recall:  [employer] of Knowledge: Fair  Language: Fair   Akathisia:  No  Handed:  Left  AIMS (if indicated): not done  Assets:  Communication Skills Desire for Improvement Housing Social Support Transportation  ADL's:  Intact  Cognition: WNL  Sleep:  improving   Screenings: Geneticist, molecular Office Visit from 03/18/2022 in [facility]  AIMS Total Score 0   GAD-7    Flowsheet Row Office Visit from 08/25/2024 in [facility] Office Visit from 01/22/2024 in [facility] Counselor from 12/09/2022 in [facility] Video Visit from 11[facility]28/2022 in [facility] Office Visit from 09/25/2022 in [facility]  Total GAD-7 Score 1 8 14 10 5    PHQ2-9    Flowsheet Row Office Visit from 08/25/2024 in [facility] Office Visit from 05/25/2024 in [facility] Office Visit from 04/06/2024 in [facility] Visit from 02/26/2024 in [facility] Office Visit from 01/22/2024 in [facility]  PHQ-2 Total Score 0 1 0 0 1  PHQ-9 Total Score -- -- -- 1 12   Flowsheet Row Office Visit from 08/25/2024 in [facility] Office Visit from 05/25/2024 in [facility] Office Visit from 02/26/2024 in [facility]  C-SSRS RISK CATEGORY No Risk No Risk No Risk     Assessment and Plan: Lawrence Pham is a 43 year old Caucasian male who has a history of depression, anxiety, insomnia was evaluated in office today.  Discussed assessment and plan as noted below.  1. Recurrent major depressive disorder, in full  remission Currently denies any significant depression symptoms Continue Lexapro  5 mg daily Continue Mirtazapine  45 mg at bedtime Continue psychotherapy sessions with Ms. Almarie Ligas  2. GAD (generalized anxiety disorder)-stable Currently does have situational anxiety occasional panic attacks although overall doing well on the current medication regimen. Continue hydroxyzine  25 mg 3 times a day as needed Continue psychotherapy sessions with Ms.Gainey  3. Insomnia due to medical condition-stable Currently reports sleep is overall good. Continue Mirtazapine  as prescribed.  4. Long term current use of cannabis Currently denies any use.  5. History of opioid abuse (HCC) Currently denies  it.  6. Benzodiazepine abuse in remission [facility]) Currently denies it.  Follow-up Follow-up in clinic in 3 months or sooner if needed.    Collaboration of Care: Collaboration of Care: Referral or follow-up with counselor/therapist AEB encouraged to continue psychotherapy sessions.  Patient/Guardian was advised Release of Information must be obtained prior to any record release in order to collaborate their care with an outside provider. Patient/Guardian was advised if they have not already done so to contact the registration department to sign all necessary forms in order for us  to release information regarding their care.   Consent: Patient/Guardian gives verbal consent for treatment and assignment of benefits for services provided during this visit. Patient/Guardian expressed understanding and agreed to proceed.   This note was generated in part or whole with voice recognition software. Voice recognition is usually quite accurate but there are transcription errors that can and very often do occur. I apologize for any typographical errors that were not detected and corrected.    Thresia Ramanathan, MD 08/25/2024, 12:53 PM

## 2024-12-14 ENCOUNTER — Telehealth: Admitting: Psychiatry

## 2024-12-22 ENCOUNTER — Encounter: Payer: Self-pay | Admitting: Psychiatry

## 2024-12-22 ENCOUNTER — Telehealth: Admitting: Psychiatry

## 2024-12-22 DIAGNOSIS — F1290 Cannabis use, unspecified, uncomplicated: Secondary | ICD-10-CM | POA: Diagnosis not present

## 2024-12-22 DIAGNOSIS — F1111 Opioid abuse, in remission: Secondary | ICD-10-CM | POA: Diagnosis not present

## 2024-12-22 DIAGNOSIS — F3342 Major depressive disorder, recurrent, in full remission: Secondary | ICD-10-CM

## 2024-12-22 DIAGNOSIS — F411 Generalized anxiety disorder: Secondary | ICD-10-CM | POA: Diagnosis not present

## 2024-12-22 DIAGNOSIS — G4701 Insomnia due to medical condition: Secondary | ICD-10-CM | POA: Diagnosis not present

## 2024-12-22 DIAGNOSIS — F1311 Sedative, hypnotic or anxiolytic abuse, in remission: Secondary | ICD-10-CM

## 2024-12-22 MED ORDER — ESCITALOPRAM OXALATE 10 MG PO TABS: 10.0000 mg | ORAL_TABLET | Freq: Every day | ORAL | 0 refills | Status: AC

## 2024-12-22 NOTE — Progress Notes (Signed)
 Virtual Visit via Video Note  I connected with Lawrence Pham on 12/22/24 at  9:00 AM EST by a video enabled telemedicine application and verified that I am speaking with the correct person using two identifiers.  Location Provider Location : [facility] Patient Location : Home  Participants: Patient , Provider    I discussed the limitations of evaluation and management by telemedicine and the availability of in person appointments. The patient expressed understanding and agreed to proceed.   I discussed the assessment and treatment plan with the patient. The patient was provided an opportunity to ask questions and all were answered. The patient agreed with the plan and demonstrated an understanding of the instructions.   The patient was advised to call back or seek an in-person evaluation if the symptoms worsen or if the condition fails to improve as anticipated.   BH MD OP Progress Note  12/22/2024 9:48 AM Lawrence Pham  MRN:  [id number]  Chief Complaint:  Chief Complaint  Patient presents with   Medication Refill   Follow-up   Anxiety   Depression   Discussed the use of AI scribe software for clinical note transcription with the patient, who gave verbal consent to proceed.  History of Present Illness Lawrence Pham is a 44 year old Caucasian male, married, disabled, lives in [place] has a history of MDD, GAD, insomnia, history of unintentional weight loss, long-term use of cannabis, chronic pain was evaluated by telemedicine today for a follow-up appointment.  He reports that concerns about his father's declining health and recent surgery, along with his wife's recent surgery, have contributed to worsening anxiety over the past few weeks. He describes feeling very anxious and notes that his nerves have been real bad, with worry and stress affecting his daily functioning. He does not report sadness, hopelessness, or loss of interest. Engaging in music helps him manage stress and  anxiety.  He reports significant sleep disturbance over the last few weeks, which he attributes to increased anxiety. He reports difficulty sleeping at night and identifies anxiety as the main factor impacting his sleep.  He does have mirtazapine  at bedtime available which he has been using.  He also has hydroxyzine  as needed available which helps with anxiety and sleep as well.  He is interested in a trial of dosage increase of Lexapro .  He normally takes it in the morning.  He is also compliant on gabapentin  which helps with mood symptoms as well as pain.  He denies side effects to any of his medications.  He has not been in psychotherapy for a long time and is interested in reestablishing care with his therapist Ms. Veva.  He is currently struggling with excessive sweating likely hyperhidrosis and uses glycopyrrolate for the same.  He does not believe it is beneficial anymore and agrees to discuss with primary care provider or his dermatologist.    Visit Diagnosis:    ICD-10-CM   1. Recurrent major depressive disorder, in full remission  Lawrence escitalopram  (LEXAPRO ) 10 MG tablet    2. GAD (generalized anxiety disorder)  Lawrence escitalopram  (LEXAPRO ) 10 MG tablet    3. Insomnia due to medical condition  G47.01    Pain, mood symptoms    4. Long term current use of cannabis  F12.90     5. History of opioid abuse (HCC)  F11.11     6. 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)  F13.11       Past Psychiatric History: I have reviewed past psychiatric history  from progress note on 01/11/2022.  Past trials of medications like Effexor, Valium.  Past Medical History:  Past Medical History:  Diagnosis Date   Allergy    Asthma    Chronic pain     Past Surgical History:  Procedure Laterality Date   MANDIBLE SURGERY Left 2005    Family Psychiatric History: I have reviewed family psychiatric history from progress note on 01/11/2022.  Family History:  Family History  Problem Relation  Age of Onset   Drug abuse Father     Social History: I have reviewed social history from progress note on 01/11/2022. Social History   Socioeconomic History   Marital status: Married    Spouse name: Not on file   Number of children: 2   Years of education: 12   Highest education level: Some college, no degree  Occupational History   Not on file  Tobacco Use   Smoking status: Never   Smokeless tobacco: Former    Types: Snuff  Vaping Use   Vaping status: Every Day   Substances: THC, Mixture of cannabinoids  Substance and Sexual Activity   Alcohol use: No   Drug use: Not Currently   Sexual activity: Not on file  Other Topics Concern   Not on file  Social History Narrative   Not on file   Social Drivers of Health   Tobacco Use: Medium Risk (12/22/2024)   Patient History    Smoking Tobacco Use: Never    Smokeless Tobacco Use: Former    Passive Exposure: Not on Actuary Strain: Low Risk (01/23/2024)   Overall Financial Resource Strain (CARDIA)    Difficulty of Paying Living Expenses: Not hard at all  Food Insecurity: No Food Insecurity (01/23/2024)   Hunger Vital Sign    Worried About Running Out of Food in the Last Year: Never true    Ran Out of Food in the Last Year: Never true  Transportation Needs: Unmet Transportation Needs (01/23/2024)   PRAPARE - Transportation    Lack of Transportation (Medical): No    Lack of Transportation (Non-Medical): Yes  Physical Activity: Insufficiently Active (01/23/2024)   Exercise Vital Sign    Days of Exercise per Week: 7 days    Minutes of Exercise per Session: 10 min  Stress: Stress Concern Present (01/23/2024)   [employer] of Occupational Health - Occupational Stress Questionnaire    Feeling of Stress : Very much  Social Connections: Moderately Integrated (01/23/2024)   Social Connection and Isolation Panel    Frequency of Communication with Friends and Family: More than three times a week    Frequency of Social  Gatherings with Friends and Family: Never    Attends Religious Services: More than 4 times per year    Active Member of Clubs or Organizations: No    Attends Banker Meetings: Never    Marital Status: Married  Depression (PHQ2-9): Low Risk (08/25/2024)   Depression (PHQ2-9)    PHQ-2 Score: 0  Alcohol Screen: Not on file  Housing: Low Risk (01/23/2024)   Housing Stability Vital Sign    Unable to Pay for Housing in the Last Year: No    Number of Times Moved in the Last Year: 0    Homeless in the Last Year: No  Utilities: Not At Risk (01/23/2024)   AHC Utilities    Threatened with loss of utilities: No  Health Literacy: Adequate Health Literacy (01/23/2024)   B1300 Health Literacy    Frequency of need  for help with medical instructions: Never    Allergies: Allergies[1]  Metabolic Disorder Labs: No results found for: HGBA1C, MPG No results found for: PROLACTIN No results found for: CHOL, TRIG, HDL, CHOLHDL, VLDL, LDLCALC Lab Results  Component Value Date   TSH 0.798 03/26/2022    Therapeutic Level Labs: No results found for: LITHIUM No results found for: VALPROATE No results found for: CBMZ  Current Medications: Current Outpatient Medications  Medication Sig Dispense Refill   escitalopram  (LEXAPRO ) 10 MG tablet Take 1 tablet (10 mg total) by mouth daily with breakfast. 90 tablet 0   alfuzosin  (UROXATRAL ) 10 MG 24 hr tablet Take 1 tablet (10 mg total) by mouth daily. 90 tablet 3   azelastine (ASTELIN) 0.1 % nasal spray Place into both nostrils.     diclofenac  (VOLTAREN ) 75 MG EC tablet Take 1 tablet (75 mg total) by mouth 2 (two) times daily as needed for moderate pain (pain score 4-6) (knee pain). 60 tablet 5   EPINEPHrine 0.3 mg/0.3 mL IJ SOAJ injection See admin instructions.     fluticasone (FLONASE) 50 MCG/ACT nasal spray Place into both nostrils.     gabapentin  (NEURONTIN ) 300 MG capsule Take 1 capsule (300 mg total) by mouth 3 (three)  times daily. 90 capsule 5   glycopyrrolate (ROBINUL) 1 MG tablet Take 2 mg by mouth daily.     hydrOXYzine  (VISTARIL ) 25 MG capsule TAKE 1 CAPSULE BY MOUTH 3 TIMES DAILY AS NEEDED FOR ANXIETY. 270 capsule 1   mirtazapine  (REMERON ) 45 MG tablet Take 1 tablet (45 mg total) by mouth at bedtime. 90 tablet 3   montelukast (SINGULAIR) 10 MG tablet Take 10 mg by mouth at bedtime.     No current facility-administered medications for this visit.     Musculoskeletal: Strength & Muscle Tone: UTA Gait & Station: Seated Patient leans: N/A  Psychiatric Specialty Exam: Review of Systems  Psychiatric/Behavioral:  Positive for sleep disturbance. The patient is nervous/anxious.     There were no vitals taken for this visit.There is no height or weight on file to calculate BMI.  General Appearance: Casual  Eye Contact:  Fair  Speech:  Normal Rate  Volume:  Normal  Mood:  Anxious  Affect:  Congruent  Thought Process:  Goal Directed and Descriptions of Associations: Intact  Orientation:  Full (Time, Place, and Person)  Thought Content: Logical   Suicidal Thoughts:  No  Homicidal Thoughts:  No  Memory:  Immediate;   Fair Recent;   Fair Remote;   Fair  Judgement:  Fair  Insight:  Fair  Psychomotor Activity:  Normal  Concentration:  Concentration: Fair and Attention Span: Fair  Recall:  [employer] of Knowledge: Fair  Language: Fair  Akathisia:  No  Handed:  Left  AIMS (if indicated): not done  Assets:  Manufacturing Systems Engineer Desire for Improvement Housing Social Support Transportation  ADL's:  Intact  Cognition: WNL  Sleep:  varies    Screenings: Geneticist, Molecular Office Visit from 03/18/2022 in [facility]  AIMS Total Score 0   GAD-7    Flowsheet Row Office Visit from 08/25/2024 in [facility] Office Visit from 01/22/2024 in [facility] Counselor from 12/09/2022  in [facility] Video Visit from 11/28/2022 in [facility] Office Visit from 09/25/2022 in [facility]  Total GAD-7 Score 1 8 14  10 5   PHQ2-9    Flowsheet Row Office Visit from 08/25/2024 in [facility] Office Visit from 05/25/2024 in [facility] Office Visit from 04/06/2024 in [facility] Visit from 02/26/2024 in [facility] Office Visit from 01/22/2024 in [facility]  PHQ-2 Total Score 0 1 0 0 1  PHQ-9 Total Score -- -- -- 1 12   Flowsheet Row Video Visit from 12/22/2024 in [facility] Office Visit from 08/25/2024 in [facility] Office Visit from 05/25/2024 in [facility]  C-SSRS RISK CATEGORY No Risk No Risk No Risk     Assessment and Plan: Lawrence Pham is a 44 year old Caucasian male who presented for a follow-up appointment, discussed assessment and plan as noted below.  1. Recurrent major depressive disorder, in full remission Currently denies any significant depression symptoms Continue Lexapro  as prescribed Continue Mirtazapine  45 mg at bedtime  2. GAD (generalized anxiety disorder)-unstable Currently struggling with worsening anxiety symptoms mostly due to situational stressors Increase Lexapro  to 10 mg daily Continue Mirtazapine  as prescribed Continue Hydroxyzine  25 mg 3 times a day as needed Encouraged to reestablish care with Ms. Veva for psychotherapy.  3. Insomnia due to medical condition-unstable Currently reports sleep problems mostly due to situational stressors and anxiety Encouraged to start CBT with  therapist again Could use an extra dose of Hydroxyzine  at bedtime if needed. Continue Mirtazapine  as prescribed.  4. Long term current use of cannabis Currently denies any use  5. History of opioid abuse (HCC) Currently denies it  6. Benzodiazepine abuse in remission [facility]) Currently denies it.  Follow-up Follow-up in clinic in 4 weeks or sooner if needed.  Collaboration of Care: Collaboration of Care: Referral or follow-up with counselor/therapist AEB encouraged to reestablish care with therapist I have also communicated with staff.  Patient/Guardian was advised Release of Information must be obtained prior to any record release in order to collaborate their care with an outside provider. Patient/Guardian was advised if they have not already done so to contact the registration department to sign all necessary forms in order for us  to release information regarding their care.   Consent: Patient/Guardian gives verbal consent for treatment and assignment of benefits for services provided during this visit. Patient/Guardian expressed understanding and agreed to proceed.   This note was generated in part or whole with voice recognition software. Voice recognition is usually quite accurate but there are transcription errors that can and very often do occur. I apologize for any typographical errors that were not detected and corrected.    Aseem Sessums, MD 12/22/2024, 9:48 AM     [1]  Allergies Allergen Reactions   Nucynta [Tapentadol] Nausea And Vomiting   Tramadol Rash

## 2025-01-11 ENCOUNTER — Ambulatory Visit: Admitting: Professional Counselor

## 2025-01-19 ENCOUNTER — Telehealth: Admitting: Psychiatry
